# Patient Record
Sex: Female | Born: 1945 | Race: White | Hispanic: No | Marital: Single | Smoking: Former smoker
Health system: Southern US, Community
[De-identification: ages and names within clinical notes are randomized; demographics above are authoritative.]

## PROBLEM LIST (undated history)

## (undated) DIAGNOSIS — J45909 Unspecified asthma, uncomplicated: Secondary | ICD-10-CM

## (undated) DIAGNOSIS — R569 Unspecified convulsions: Secondary | ICD-10-CM

## (undated) DIAGNOSIS — E119 Type 2 diabetes mellitus without complications: Secondary | ICD-10-CM

## (undated) DIAGNOSIS — S0990XA Unspecified injury of head, initial encounter: Secondary | ICD-10-CM

## (undated) DIAGNOSIS — J449 Chronic obstructive pulmonary disease, unspecified: Secondary | ICD-10-CM

## (undated) DIAGNOSIS — E079 Disorder of thyroid, unspecified: Secondary | ICD-10-CM

## (undated) HISTORY — PX: CHOLECYSTECTOMY: SHX55

## (undated) HISTORY — PX: BACK SURGERY: SHX140

## (undated) HISTORY — PX: ABDOMINAL HYSTERECTOMY: SHX81

---

## 2018-10-31 ENCOUNTER — Emergency Department (HOSPITAL_BASED_OUTPATIENT_CLINIC_OR_DEPARTMENT_OTHER): Payer: Medicare Other

## 2018-10-31 ENCOUNTER — Other Ambulatory Visit: Payer: Self-pay

## 2018-10-31 ENCOUNTER — Encounter (HOSPITAL_BASED_OUTPATIENT_CLINIC_OR_DEPARTMENT_OTHER): Payer: Self-pay | Admitting: *Deleted

## 2018-10-31 ENCOUNTER — Emergency Department (HOSPITAL_BASED_OUTPATIENT_CLINIC_OR_DEPARTMENT_OTHER)
Admission: EM | Admit: 2018-10-31 | Discharge: 2018-10-31 | Disposition: A | Discharge status: Home / Self Care | Payer: Medicare Other | Attending: Emergency Medicine | Admitting: Emergency Medicine

## 2018-10-31 DIAGNOSIS — Y998 Other external cause status: Secondary | ICD-10-CM | POA: Insufficient documentation

## 2018-10-31 DIAGNOSIS — Z79899 Other long term (current) drug therapy: Secondary | ICD-10-CM | POA: Diagnosis not present

## 2018-10-31 DIAGNOSIS — W01198A Fall on same level from slipping, tripping and stumbling with subsequent striking against other object, initial encounter: Secondary | ICD-10-CM | POA: Insufficient documentation

## 2018-10-31 DIAGNOSIS — J449 Chronic obstructive pulmonary disease, unspecified: Secondary | ICD-10-CM | POA: Insufficient documentation

## 2018-10-31 DIAGNOSIS — M542 Cervicalgia: Secondary | ICD-10-CM | POA: Insufficient documentation

## 2018-10-31 DIAGNOSIS — Z87891 Personal history of nicotine dependence: Secondary | ICD-10-CM | POA: Diagnosis not present

## 2018-10-31 DIAGNOSIS — R519 Headache, unspecified: Secondary | ICD-10-CM | POA: Insufficient documentation

## 2018-10-31 DIAGNOSIS — Y9301 Activity, walking, marching and hiking: Secondary | ICD-10-CM | POA: Diagnosis not present

## 2018-10-31 DIAGNOSIS — Y92480 Sidewalk as the place of occurrence of the external cause: Secondary | ICD-10-CM | POA: Insufficient documentation

## 2018-10-31 DIAGNOSIS — E119 Type 2 diabetes mellitus without complications: Secondary | ICD-10-CM | POA: Diagnosis not present

## 2018-10-31 DIAGNOSIS — W19XXXA Unspecified fall, initial encounter: Secondary | ICD-10-CM

## 2018-10-31 HISTORY — DX: Type 2 diabetes mellitus without complications: E11.9

## 2018-10-31 HISTORY — DX: Chronic obstructive pulmonary disease, unspecified: J44.9

## 2018-10-31 HISTORY — DX: Disorder of thyroid, unspecified: E07.9

## 2018-10-31 HISTORY — DX: Unspecified convulsions: R56.9

## 2018-10-31 HISTORY — DX: Unspecified asthma, uncomplicated: J45.909

## 2018-10-31 HISTORY — DX: Unspecified injury of head, initial encounter: S09.90XA

## 2018-10-31 MED ORDER — ACETAMINOPHEN 325 MG PO TABS
650.0000 mg | ORAL_TABLET | Freq: Once | ORAL | Status: AC
  Administered 2018-10-31: 650 mg via ORAL
  Filled 2018-10-31: qty 2

## 2018-10-31 NOTE — ED Provider Notes (Addendum)
[facility] EMERGENCY DEPARTMENT Provider Note   CSN: [id number] Arrival date & time: 10/31/18  2048     History   Chief Complaint Chief Complaint  Patient presents with   Fall    HPI Kimberly Stafford is a 73 y.o. female with past medical history significant for asthma, copd, seizures, hypothyroidism since emergency department today with chief complaint of fall.  Patient is not anticoagulated.  Patient states she was walking her dog and she did not see the curb step up so she fell forward and landed on her left cheek.  She denies LOC.  Her neighbor heard and called 911.  Patient was able to get up with assistance and has been ambulatory since.  She is complaining of a mild headache and pain in her neck. She states pain has been constant. She describes it as aching, she rates pain 6/10 in severity. She has not taken anything for pain prior to arrival.    Past Medical History:  Diagnosis Date   Asthma    COPD (chronic obstructive pulmonary disease) (HCC)    Diabetes mellitus without complication (HCC)    states she had it for a while but "reversed it"   Head injury    Seizures (HCC)    Thyroid disease     There are no active problems to display for this patient.   Past Surgical History:  Procedure Laterality Date   ABDOMINAL HYSTERECTOMY     BACK SURGERY     CHOLECYSTECTOMY       OB History   No obstetric history on file.      Home Medications    Prior to Admission medications   Medication Sig Start Date End Date Taking? Authorizing Provider  DULoxetine (CYMBALTA) 30 MG capsule Take 30 mg by mouth daily.   Yes [provider]  lamoTRIgine (LAMICTAL) 200 MG tablet Take 100 mg by mouth 2 (two) times daily.   Yes [provider]  levothyroxine (SYNTHROID) 88 MCG tablet Take 88 mcg by mouth daily before breakfast.   Yes [provider]  metoprolol succinate (TOPROL-XL) 25 MG 24 hr tablet Take 12.5 mg by mouth at bedtime.   Yes  [provider]  montelukast (SINGULAIR) 10 MG tablet Take 10 mg by mouth at bedtime.   Yes [provider]    Family History No family history on file.  Social History Social History   Tobacco Use   Smoking status: Former Smoker   Smokeless tobacco: Never Used  Substance Use Topics   Alcohol use: Never    Frequency: Never   Drug use: Never     Allergies   Patient has no known allergies.   Review of Systems Review of Systems  Constitutional: Negative for chills and fever.  HENT: Negative for congestion, ear discharge, ear pain, facial swelling, sinus pressure, sinus pain and sore throat.   Eyes: Negative for pain and redness.  Respiratory: Negative for cough, shortness of breath and wheezing.   Cardiovascular: Negative for chest pain.  Gastrointestinal: Negative for abdominal pain, constipation, diarrhea, nausea and vomiting.  Genitourinary: Negative for dysuria and hematuria.  Musculoskeletal: Positive for arthralgias. Negative for back pain and neck pain.  Skin: Negative for wound.  Neurological: Negative for weakness, numbness and headaches.     Physical Exam Updated Vital Signs BP (!) 156/83 (BP Location: Right Arm)    Pulse 70    Temp 98.3 F (36.8 C) (Oral)    Resp 18    Ht   (1.6 m)    Wt 70.3 kg    SpO2 100%    BMI 27.46 kg/m   Physical Exam Vitals signs and nursing note reviewed.  Constitutional:      General: She is not in acute distress.    Appearance: She is not ill-appearing.  HENT:     Head: Normocephalic and atraumatic. No raccoon eyes, Battle's sign or contusion.     Jaw: There is normal jaw occlusion.      Comments: No tenderness to palpation of skull. No deformities or crepitus noted. No open wounds, abrasions or lacerations.    Right Ear: Tympanic membrane and external ear normal. No hemotympanum.     Left Ear: Tympanic membrane and external ear normal. No hemotympanum.     Nose: Nose normal. No nasal deformity or  nasal tenderness.     Right Nostril: No septal hematoma.     Left Nostril: No septal hematoma.     Mouth/Throat:     Mouth: Mucous membranes are moist.     Pharynx: Oropharynx is clear.  Eyes:     General: No scleral icterus.       Right eye: No discharge.        Left eye: No discharge.     Extraocular Movements: Extraocular movements intact.     Conjunctiva/sclera: Conjunctivae normal.     Pupils: Pupils are equal, round, and reactive to light.     Comments: No pain with EOMs.  Neck:     Musculoskeletal: Normal range of motion.     Vascular: No JVD.     Comments: Full ROM intact without spinous process TTP. No bony stepoffs or deformities, no paraspinous muscle TTP or muscle spasms. No bruising, erythema, or swelling.  Cardiovascular:     Rate and Rhythm: Normal rate and regular rhythm.     Pulses: Normal pulses.          Radial pulses are 2+ on the right side and 2+ on the left side.     Heart sounds: Normal heart sounds.  Pulmonary:     Comments: Lungs clear to auscultation in all fields. Symmetric chest rise. No wheezing, rales, or rhonchi. Abdominal:     Comments: Abdomen is soft, non-distended, and non-tender in all quadrants. No rigidity, no guarding. No peritoneal signs.  Musculoskeletal: Normal range of motion.     Comments: Pelvis is stable.  Full range of motion of all 4 extremities without signs of injury.  Full range of motion of the thoracic spine and lumbar spine. No cervical, thoracic, or lumbar spinal tenderness to palpation. No paraspinal tenderness. No step offs, crepitus or deformity palpated.   Skin:    General: Skin is warm and dry.     Capillary Refill: Capillary refill takes less than 2 seconds.  Neurological:     Mental Status: She is oriented to person, place, and time.     GCS: GCS eye subscore is 4. GCS verbal subscore is 5. GCS motor subscore is 6.     Comments: Speech is clear and goal oriented, follows commands CN III-XII intact, no facial  droop Normal strength in upper and lower extremities bilaterally including dorsiflexion and plantar flexion, strong and equal grip strength Sensation normal to light and sharp touch Moves extremities without ataxia, coordination intact Normal finger to nose and rapid alternating movements Normal gait and balance  Psychiatric:        Behavior: Behavior normal.      ED Treatments / Results  Labs (all labs ordered are listed, but only abnormal results are displayed) Labs Reviewed - No data to display  EKG None  Radiology Dg Chest 2 View  Result Date: 10/31/2018 CLINICAL DATA:  Larey Seat today. No reported chest pain. EXAM: CHEST - 2 VIEW COMPARISON:  None. FINDINGS: Normal sized heart. Tortuous aorta. Mild patchy opacity in the lateral aspect of the right lower lung zone. Mild diffuse peribronchial thickening. Diffuse osteopenia with no fracture or pneumothorax seen. IMPRESSION: 1. Mild patchy opacity in the lateral aspect of the right lower lung zone. This could represent a mild amount of pulmonary contusion or pneumonia. 2. Mild bronchitic changes. Electronically Signed   By: Beckie Salts M.D.   On: 10/31/2018 22:31   Dg Pelvis 1-2 Views  Result Date: 10/31/2018 CLINICAL DATA:  Larey Seat today. No reported pelvic pain. EXAM: PELVIS - 1-2 VIEW COMPARISON:  None. FINDINGS: Diffuse osteopenia. No fracture or dislocation seen. IMPRESSION: No fracture or dislocation. Electronically Signed   By: Beckie Salts M.D.   On: 10/31/2018 22:32   Ct Head Wo Contrast  Result Date: 10/31/2018 CLINICAL DATA:  Larey Seat and hit the left side of her face on a concrete patio. EXAM: CT HEAD WITHOUT CONTRAST CT MAXILLOFACIAL WITHOUT CONTRAST CT CERVICAL SPINE WITHOUT CONTRAST TECHNIQUE: Multidetector CT imaging of the head, cervical spine, and maxillofacial structures were performed using the standard protocol without intravenous contrast. Multiplanar CT image reconstructions of the cervical spine and maxillofacial  structures were also generated. COMPARISON:  None. FINDINGS: CT HEAD FINDINGS Brain: Mildly enlarged ventricles and cortical sulci. Mild patchy white matter low density in both cerebral hemispheres. No intracranial hemorrhage, mass lesion or CT evidence of acute infarction. Vascular: No hyperdense vessel or unexpected calcification. Skull: Bilateral hyperostosis frontalis. No fractures. Other: None. CT MAXILLOFACIAL FINDINGS Osseous: No fracture or mandibular dislocation. No destructive process. Orbits: Negative. No traumatic or inflammatory finding. Status post bilateral cataract extraction. Sinuses: Clear. Soft tissues: Negative. CT CERVICAL SPINE FINDINGS Alignment: Normal. Skull base and vertebrae: No acute fracture. No primary bone lesion or focal pathologic process. Soft tissues and spinal canal: No prevertebral fluid or swelling. No visible canal hematoma. Disc levels:  Mild multilevel degenerative changes. Upper chest: Lung apices. Other: Minimal carotid artery calcification on the right. IMPRESSION: 1. No skull fracture or intracranial hemorrhage. 2. No maxillofacial fracture. 3. No cervical spine fracture or subluxation. 4. Mild diffuse cerebral and cerebellar atrophy and mild chronic small vessel white matter ischemic changes in both cerebral hemispheres. 5. Mild cervical spine degenerative changes. 6. Minimal right carotid artery atheromatous calcification. Electronically Signed   By: Beckie Salts M.D.   On: 10/31/2018 22:18   Ct Cervical Spine Wo Contrast  Result Date: 10/31/2018 CLINICAL DATA:  Larey Seat and hit the left side of her face on a concrete patio. EXAM: CT HEAD WITHOUT CONTRAST CT MAXILLOFACIAL WITHOUT CONTRAST CT CERVICAL SPINE WITHOUT CONTRAST TECHNIQUE: Multidetector CT imaging of the head, cervical spine, and maxillofacial structures were performed using the standard protocol without intravenous contrast. Multiplanar CT image reconstructions of the cervical spine and maxillofacial  structures were also generated. COMPARISON:  None. FINDINGS: CT HEAD FINDINGS Brain: Mildly enlarged ventricles and cortical sulci. Mild patchy white matter low density in both cerebral hemispheres. No intracranial hemorrhage, mass lesion or CT evidence of acute infarction. Vascular: No hyperdense vessel or unexpected calcification. Skull: Bilateral hyperostosis frontalis. No fractures. Other: None. CT MAXILLOFACIAL FINDINGS Osseous: No fracture or mandibular dislocation. No destructive process. Orbits: Negative. No traumatic or inflammatory finding. Status  post bilateral cataract extraction. Sinuses: Clear. Soft tissues: Negative. CT CERVICAL SPINE FINDINGS Alignment: Normal. Skull base and vertebrae: No acute fracture. No primary bone lesion or focal pathologic process. Soft tissues and spinal canal: No prevertebral fluid or swelling. No visible canal hematoma. Disc levels:  Mild multilevel degenerative changes. Upper chest: Lung apices. Other: Minimal carotid artery calcification on the right. IMPRESSION: 1. No skull fracture or intracranial hemorrhage. 2. No maxillofacial fracture. 3. No cervical spine fracture or subluxation. 4. Mild diffuse cerebral and cerebellar atrophy and mild chronic small vessel white matter ischemic changes in both cerebral hemispheres. 5. Mild cervical spine degenerative changes. 6. Minimal right carotid artery atheromatous calcification. Electronically Signed   By: Claudie Revering M.D.   On: 10/31/2018 22:18   Ct Maxillofacial Wo Contrast  Result Date: 10/31/2018 CLINICAL DATA:  [address] and hit the left side of her face on a concrete patio. EXAM: CT HEAD WITHOUT CONTRAST CT MAXILLOFACIAL WITHOUT CONTRAST CT CERVICAL SPINE WITHOUT CONTRAST TECHNIQUE: Multidetector CT imaging of the head, cervical spine, and maxillofacial structures were performed using the standard protocol without intravenous contrast. Multiplanar CT image reconstructions of the cervical spine and maxillofacial  structures were also generated. COMPARISON:  None. FINDINGS: CT HEAD FINDINGS Brain: Mildly enlarged ventricles and cortical sulci. Mild patchy white matter low density in both cerebral hemispheres. No intracranial hemorrhage, mass lesion or CT evidence of acute infarction. Vascular: No hyperdense vessel or unexpected calcification. Skull: Bilateral hyperostosis frontalis. No fractures. Other: None. CT MAXILLOFACIAL FINDINGS Osseous: No fracture or mandibular dislocation. No destructive process. Orbits: Negative. No traumatic or inflammatory finding. Status post bilateral cataract extraction. Sinuses: Clear. Soft tissues: Negative. CT CERVICAL SPINE FINDINGS Alignment: Normal. Skull base and vertebrae: No acute fracture. No primary bone lesion or focal pathologic process. Soft tissues and spinal canal: No prevertebral fluid or swelling. No visible canal hematoma. Disc levels:  Mild multilevel degenerative changes. Upper chest: Lung apices. Other: Minimal carotid artery calcification on the right. IMPRESSION: 1. No skull fracture or intracranial hemorrhage. 2. No maxillofacial fracture. 3. No cervical spine fracture or subluxation. 4. Mild diffuse cerebral and cerebellar atrophy and mild chronic small vessel white matter ischemic changes in both cerebral hemispheres. 5. Mild cervical spine degenerative changes. 6. Minimal right carotid artery atheromatous calcification. Electronically Signed   By: Claudie Revering M.D.   On: 10/31/2018 22:18    Procedures Procedures (including critical care time)  Medications Ordered in ED Medications  acetaminophen (TYLENOL) tablet 650 mg (650 mg Oral Given 10/31/18 2147)     Initial Impression / Assessment and Plan / ED Course  I have reviewed the triage vital signs and the nursing notes.  Pertinent labs & imaging results that were available during my care of the patient were reviewed by me and considered in my medical decision making (see chart for details).  Patient  presents after a head injury. Patient denies  red flags including LOC, significant mechanism, vomiting, seizure, severe headache, intoxication, or neuro deficits. Neurological exam is normal. No signs of skull fracture including battle's sign, raccoon eyes, nasal CSF leak, or hemotympanum present. Pupils are symmetric and no distracting injury is present. CT head, cervical spine, maxillofacial are negative for an acute intracranial abnormality.  X-ray of chest with report of mild patchy opacity in the lateral aspect of the right lower lung zone. This could represent a mild amount of pulmonary contusion or pneumonia.  On exam lungs are clear to auscultation in all fields.  Doubt pneumonia as she has  no respiratory symptoms.  Given her reassuring exam will not start antibiotic at this time.  X-ray of pelvis is negative for fracture dislocation.  Ambulated patient and she did so with steady gait. Patient is stable and will be discharged. Discussed strict return precautions with patient and her niece. Patient states understanding and agrees with plan. The patient was discussed with and seen by Dr. Particia Nearing who agrees with the treatment plan.    Portions of this note were generated with Scientist, clinical (histocompatibility and immunogenetics). Dictation errors may occur despite best attempts at proofreading.  Final Clinical Impressions(s) / ED Diagnoses   Final diagnoses:  Fall, initial encounter    ED Discharge Orders    None       Sherene Sires, PA-C 10/31/18 2351    Alexiz Sustaita, [place], PA-C 11/01/18 0054    Jacalyn Lefevre, MD 11/01/18 [phone]

## 2018-10-31 NOTE — Discharge Instructions (Signed)
°  Your CT scans and xrays today do not show any broken bones or bleeding in your brain.  Adult head injury: You have had a head injury which does not appear to require admission at this time. A concussion is a state of changed mental ability from trauma.  SEEK IMMEDIATE MEDICAL ATTENTION IF:  - There is confusion or drowsiness. You cannot awaken the injured person.   - There is nausea (feeling sick to your stomach) or continued, forceful vomiting. You notice dizziness or unsteadiness which is getting worse, or inability to walk.   - You have convulsions or unconsciousness.   - You experience severe, persistent headaches not relieved by Tylenol. (Do not take aspirin as this impairs clotting abilities). Take other pain medications only as directed.   - You cannot use arms or legs normally.   - There are changes in pupil sizes. (This is the black center in the colored part of the eye)   - There is clear or bloody discharge from the nose or ears.   - Change in speech, vision, swallowing, or understanding.   - Localized weakness, numbness, tingling, or change in bowel or bladder control.

## 2018-10-31 NOTE — ED Triage Notes (Signed)
Pt reports she lives at the [place] and fell outside walking her dog. States she hit the left side of her face on cement patio when she fell. Denies pain at present. Pt is a poor historian, has Hx of multiple head injuries. Her niece and POA is at bedside

## 2018-11-26 ENCOUNTER — Telehealth: Payer: Self-pay | Admitting: Neurology

## 2018-11-26 NOTE — Telephone Encounter (Signed)
Of course she can be on the virtual visit. Please get the contact info. Thank you

## 2018-11-26 NOTE — Telephone Encounter (Signed)
Patient is in a SNF and her POA wants to be apart of the Virtual visit tomorrow on 11-27-18 @ 1:00 she wants to make sure that we can do a like a three virtual visit with her and the SNF please call

## 2018-11-26 NOTE — Telephone Encounter (Signed)
Got the information that was needed for the SNF to do the virtual visit with POA as well

## 2018-11-27 ENCOUNTER — Encounter: Payer: Self-pay | Admitting: Neurology

## 2018-11-27 ENCOUNTER — Other Ambulatory Visit: Payer: Self-pay

## 2018-11-27 ENCOUNTER — Telehealth (INDEPENDENT_AMBULATORY_CARE_PROVIDER_SITE_OTHER): Payer: Medicare Other | Admitting: Neurology

## 2018-11-27 VITALS — Ht 61.0 in | Wt 155.0 lb

## 2018-11-27 DIAGNOSIS — F0391 Unspecified dementia with behavioral disturbance: Secondary | ICD-10-CM | POA: Diagnosis not present

## 2018-11-27 DIAGNOSIS — F03B18 Unspecified dementia, moderate, with other behavioral disturbance: Secondary | ICD-10-CM

## 2018-11-27 MED ORDER — QUETIAPINE FUMARATE 25 MG PO TABS: ORAL_TABLET | ORAL | 11 refills | Status: DC
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
## 2018-12-02 ENCOUNTER — Other Ambulatory Visit: Payer: Self-pay

## 2018-12-02 MED ORDER — QUETIAPINE FUMARATE 25 MG PO TABS: ORAL_TABLET | ORAL | 11 refills | Status: AC

## 2018-12-02 NOTE — Telephone Encounter (Signed)
Seroquel faxed to [facility] in [place]. Looks like the prescription was originally sent to the [facility] in [place].

## 2018-12-02 NOTE — Telephone Encounter (Signed)
Kimberly Stafford Kimberly Stafford) for patient called in today regarding her facility Kimberly Stafford not receiving Rx for Seroquel. She said she had called in this morning to see how she was doing on the new medication and they didn't have any new Rx. She Kimberly Stafford had just moved there and they have to use their pharmacy Kimberly Stafford 1 [phone]. She said that it can be called in or E scribed. Thank you

## 2018-12-03 ENCOUNTER — Emergency Department (HOSPITAL_COMMUNITY): Payer: Medicare Other

## 2018-12-03 ENCOUNTER — Emergency Department (HOSPITAL_COMMUNITY)
Admission: EM | Admit: 2018-12-03 | Discharge: 2018-12-03 | Disposition: A | Discharge status: Home / Self Care | Payer: Medicare Other | Attending: Emergency Medicine | Admitting: Emergency Medicine

## 2018-12-03 ENCOUNTER — Other Ambulatory Visit: Payer: Self-pay

## 2018-12-03 DIAGNOSIS — R519 Headache, unspecified: Secondary | ICD-10-CM | POA: Diagnosis not present

## 2018-12-03 DIAGNOSIS — W0110XA Fall on same level from slipping, tripping and stumbling with subsequent striking against unspecified object, initial encounter: Secondary | ICD-10-CM | POA: Insufficient documentation

## 2018-12-03 DIAGNOSIS — Y92129 Unspecified place in nursing home as the place of occurrence of the external cause: Secondary | ICD-10-CM | POA: Insufficient documentation

## 2018-12-03 DIAGNOSIS — Y999 Unspecified external cause status: Secondary | ICD-10-CM | POA: Insufficient documentation

## 2018-12-03 DIAGNOSIS — Z79899 Other long term (current) drug therapy: Secondary | ICD-10-CM | POA: Insufficient documentation

## 2018-12-03 DIAGNOSIS — J449 Chronic obstructive pulmonary disease, unspecified: Secondary | ICD-10-CM | POA: Insufficient documentation

## 2018-12-03 DIAGNOSIS — Z87891 Personal history of nicotine dependence: Secondary | ICD-10-CM | POA: Diagnosis not present

## 2018-12-03 DIAGNOSIS — E119 Type 2 diabetes mellitus without complications: Secondary | ICD-10-CM | POA: Insufficient documentation

## 2018-12-03 DIAGNOSIS — Y939 Activity, unspecified: Secondary | ICD-10-CM | POA: Diagnosis not present

## 2018-12-03 DIAGNOSIS — W19XXXA Unspecified fall, initial encounter: Secondary | ICD-10-CM

## 2018-12-03 NOTE — ED Notes (Signed)
[place]

## 2018-12-03 NOTE — ED Notes (Signed)
Pt unable to sign for AVS due to altered mental status related to dementia. AVS sent with PTAR for facility.

## 2018-12-03 NOTE — ED Triage Notes (Signed)
73 yo female BIB [facility] from [facility]. Pt had an unwitnessed fall approx. 45 min ago. Facility states she tripped over the coffee table. Pt c/o back pain and neck pain and headache. Pt states she feels as though she has a broken tooth. EMS placed pt in neck collar as precaution. Pt is not on blood thinners, no LOC as per [facility]. Pt is AOx2 at baseline.   Vitals: bp 135/75 Hr 64 rr 18 O2 97 cbg 122 Temp 97.4

## 2018-12-03 NOTE — ED Notes (Signed)
Patient transported to CT 

## 2018-12-03 NOTE — ED Notes (Signed)
[facility] called 

## 2018-12-03 NOTE — Discharge Instructions (Addendum)
Kimberly Stafford was seen in the ER for a fall  CT scans were normal  Monitor and return for sudden changes in mental status, stroke symptoms

## 2018-12-03 NOTE — ED Provider Notes (Signed)
[facility]-EMERGENCY DEPT Provider Note   CSN: [id number] Arrival date & time: 12/03/18  1628     History   Chief Complaint Chief Complaint  Patient presents with   Fall    HPI Kimberly Stafford is a 73 y.o. female brought to the ER by EMS from [facility] assisted living for evaluation of fall.  Per triage note, patient had an unwitnessed fall approximately an hour prior to arrival, facility staff reported patient tripped over a coffee table and fell backwards.  Patient is fully oriented to self, place, year.  When asked what happened she does not remember, she was told she had a fall and then was able to tell me she was walking and fell over a piece of furniture and fell backwards landing on her head.  Reports mild posterior scalp tenderness but otherwise denies any other physical injuries.  She was placed in cervical collar by EMS.  No modifying factors.  Denies associated headache, vision changes, chest pain, back pain or other extremity injury.  She is not on blood thinners per EMS.  POA is niece.     HPI  Past Medical History:  Diagnosis Date   Asthma    COPD (chronic obstructive pulmonary disease) (HCC)    Diabetes mellitus without complication (HCC)    states she had it for a while but "reversed it"   Head injury    Seizures (HCC)    Thyroid disease     There are no active problems to display for this patient.   Past Surgical History:  Procedure Laterality Date   ABDOMINAL HYSTERECTOMY     BACK SURGERY     CHOLECYSTECTOMY       OB History   No obstetric history on file.      Home Medications    Prior to Admission medications   Medication Sig Start Date End Date Taking? Authorizing Provider  DULoxetine (CYMBALTA) 30 MG capsule Take 30 mg by mouth daily.    [provider]  lamoTRIgine (LAMICTAL) 200 MG tablet Take 100 mg by mouth daily.     [provider]  levothyroxine (SYNTHROID) 88 MCG tablet Take 88 mcg by mouth  daily before breakfast.    [provider]  metoprolol succinate (TOPROL-XL) 25 MG 24 hr tablet Take 12.5 mg by mouth at bedtime.    [provider]  montelukast (SINGULAIR) 10 MG tablet Take 10 mg by mouth at bedtime.    [provider]  QUEtiapine (SEROQUEL) 25 MG tablet Take 1/2 tablet every night for 1 week, then increase to 1 tablet every night and continue 12/02/18   Van Clines, MD    Family History Family History  Problem Relation Age of Onset   COPD Mother 25   Diabetes Father 74   Diabetes Brother     Social History Social History   Tobacco Use   Smoking status: Former Smoker   Smokeless tobacco: Never Used  Substance Use Topics   Alcohol use: Never    Frequency: Never   Drug use: Never     Allergies   Patient has no known allergies.   Review of Systems Review of Systems  Unable to perform ROS: Dementia  All other systems reviewed and are negative.    Physical Exam Updated Vital Signs BP 108/84 (BP Location: Right Arm)    Pulse 63    Temp 98.3 F (36.8 C) (Axillary)    Resp 16    SpO2 99%  Physical Exam Vitals signs and nursing note reviewed.  Constitutional:      General: She is not in acute distress.    Appearance: She is well-developed.     Comments: NAD.  Awake, alert and Hall bed.  HENT:     Head: Normocephalic and atraumatic.     Comments: No scalp tenderness.  No signs of abrasion or injury to scalp.  No facial tenderness.    Right Ear: External ear normal.     Left Ear: External ear normal.     Nose: Nose normal.     Mouth/Throat:     Comments: No intraoral or tongue injury. Eyes:     General: No scleral icterus.    Conjunctiva/sclera: Conjunctivae normal.  Neck:     Musculoskeletal: Normal range of motion and neck supple.     Comments: C-spine: Diffuse midline and paraspinal muscle tenderness around C7.  Cervical collar in place.  Trachea midline. Cardiovascular:     Rate and Rhythm: Normal rate  and regular rhythm.     Heart sounds: Normal heart sounds.  Pulmonary:     Effort: Pulmonary effort is normal.     Breath sounds: Normal breath sounds.  Musculoskeletal: Normal range of motion.        General: No deformity.     Comments: Patient able to sit up on her own on the bed without obvious difficulty. TL spine: No midline or paraspinal muscle tenderness.  No bruising or signs of trauma to the back/skin. Full passive range of motion of the upper and lower extremities without pain.  Skin:    General: Skin is warm and dry.     Capillary Refill: Capillary refill takes less than 2 seconds.  Neurological:     Mental Status: She is alert and oriented to person, place, and time.     Comments:   Mental Status: Patient is awake, alert, oriented to person, place, year, and situation.  Patient is able to give a clear and coherent history. Speech is fluent and clear without dysarthria or aphasia. No signs of neglect. Cranial Nerves: I not tested II visual fields not tested. PERRL.   III, IV, VI EOMs intact without ptosis or diplopia  V sensation to light touch intact in all 3 divisions of trigeminal nerve bilaterally  VII facial movements symmetric bilaterally VIII hearing intact to voice/conversation  IX, X no uvula deviation, symmetric rise of soft palate/uvula XI 5/5 SCM and trapezius strength bilaterally  XII tongue protrusion midline, symmetric L/R movements  Motor: Strength 5/5 in upper/lower extremities .   Sensation to light touch intact in face, upper/lower extremities. No pronator drift. No leg drop.  Cerebellar: No ataxia with finger to nose.  Sits on side of bed without truncal sway.   Psychiatric:        Behavior: Behavior normal.        Thought Content: Thought content normal.        Judgment: Judgment normal.      ED Treatments / Results  Labs (all labs ordered are listed, but only abnormal results are displayed) Labs Reviewed - No data to  display  EKG None  Radiology Ct Head Wo Contrast  Result Date: 12/03/2018 CLINICAL DATA:  Head trauma, ataxia, unwitnessed fall EXAM: CT HEAD WITHOUT CONTRAST CT CERVICAL SPINE WITHOUT CONTRAST TECHNIQUE: Multidetector CT imaging of the head and cervical spine was performed following the standard protocol without intravenous contrast. Multiplanar CT image reconstructions of the cervical spine were also generated. COMPARISON:  CT 10/31/2018 FINDINGS: CT HEAD FINDINGS Brain: No evidence of acute infarction, hemorrhage, hydrocephalus, extra-axial collection or mass lesion/mass effect. Symmetric prominence of the ventricles, cisterns and sulci compatible with parenchymal volume loss. Patchy areas of white matter hypoattenuation are most compatible with chronic microvascular angiopathy. Vascular: Atherosclerotic calcification of the carotid siphons and intradural vertebral arteries. No hyperdense vessel. Skull: Mild left supraorbital soft tissue thickening. No subjacent hematoma or calvarial fracture. No suspicious osseous lesions. Hyperostosis frontalis interna is noted incidentally Sinuses/Orbits: Small amount of fluid layering in the sphenoid sinus, similar to prior. Remaining paranasal sinuses are predominantly clear. Mastoid air cells and middle ear cavities are clear. Other: None CT CERVICAL SPINE FINDINGS Alignment: Preservation of the normal cervical lordosis. Mild dextrocurvature of the cervical spine possibly positional with stabilization collar in place. Slight anterolisthesis of C4 on C5 and retrolisthesis C5 on C6 is on a degenerative basis, unchanged from comparison study. No abnormal facet widening. Craniocervical and atlantoaxial articulations are normally aligned. Skull base and vertebrae: No acute fracture. No primary bone lesion or focal pathologic process. Soft tissues and spinal canal: No pre or paravertebral fluid or swelling. No visible canal hematoma. Disc levels: Multilevel intervertebral  disc height loss with spondylitic endplate changes. Mild to moderate facet hypertrophic changes are noted as well. Findings are maximal at C5-6 and C6-7. At most mild multilevel neural foraminal narrowing. No significant canal stenosis. Upper chest: Apical pleuroparenchymal changes are noted. Other: Mild cervical carotid atherosclerosis. Included portions of the thyroid are unremarkable. IMPRESSION: 1. No acute intracranial abnormality. 2. Stable parenchymal volume loss and chronic microvascular ischemic white matter disease. 3. No acute cervical spine fracture or traumatic listhesis. 4. Stable multilevel degenerative changes of the cervical spine, detailed above. Electronically Signed   By: Kreg Shropshire M.D.   On: 12/03/2018 19:57   Ct Cervical Spine Wo Contrast  Result Date: 12/03/2018 CLINICAL DATA:  Head trauma, ataxia, unwitnessed fall EXAM: CT HEAD WITHOUT CONTRAST CT CERVICAL SPINE WITHOUT CONTRAST TECHNIQUE: Multidetector CT imaging of the head and cervical spine was performed following the standard protocol without intravenous contrast. Multiplanar CT image reconstructions of the cervical spine were also generated. COMPARISON:  CT 10/31/2018 FINDINGS: CT HEAD FINDINGS Brain: No evidence of acute infarction, hemorrhage, hydrocephalus, extra-axial collection or mass lesion/mass effect. Symmetric prominence of the ventricles, cisterns and sulci compatible with parenchymal volume loss. Patchy areas of white matter hypoattenuation are most compatible with chronic microvascular angiopathy. Vascular: Atherosclerotic calcification of the carotid siphons and intradural vertebral arteries. No hyperdense vessel. Skull: Mild left supraorbital soft tissue thickening. No subjacent hematoma or calvarial fracture. No suspicious osseous lesions. Hyperostosis frontalis interna is noted incidentally Sinuses/Orbits: Small amount of fluid layering in the sphenoid sinus, similar to prior. Remaining paranasal sinuses are  predominantly clear. Mastoid air cells and middle ear cavities are clear. Other: None CT CERVICAL SPINE FINDINGS Alignment: Preservation of the normal cervical lordosis. Mild dextrocurvature of the cervical spine possibly positional with stabilization collar in place. Slight anterolisthesis of C4 on C5 and retrolisthesis C5 on C6 is on a degenerative basis, unchanged from comparison study. No abnormal facet widening. Craniocervical and atlantoaxial articulations are normally aligned. Skull base and vertebrae: No acute fracture. No primary bone lesion or focal pathologic process. Soft tissues and spinal canal: No pre or paravertebral fluid or swelling. No visible canal hematoma. Disc levels: Multilevel intervertebral disc height loss with spondylitic endplate changes. Mild to moderate facet hypertrophic changes are noted as well. Findings are maximal at C5-6 and C6-7. At  most mild multilevel neural foraminal narrowing. No significant canal stenosis. Upper chest: Apical pleuroparenchymal changes are noted. Other: Mild cervical carotid atherosclerosis. Included portions of the thyroid are unremarkable. IMPRESSION: 1. No acute intracranial abnormality. 2. Stable parenchymal volume loss and chronic microvascular ischemic white matter disease. 3. No acute cervical spine fracture or traumatic listhesis. 4. Stable multilevel degenerative changes of the cervical spine, detailed above. Electronically Signed   By: Lovena Le M.D.   On: 12/03/2018 19:57    Procedures Procedures (including critical care time)  Medications Ordered in ED Medications - No data to display   Initial Impression / Assessment and Plan / ED Course  I have reviewed the triage vital signs and the nursing notes.  Pertinent labs & imaging results that were available during my care of the patient were reviewed by me and considered in my medical decision making (see chart for details).  Obtained corroborating information from patient's POA who  was notified patient had fallen backwards and hit the back of her head.  Exam is reassuring, no concerning findings to suggest severe head, cervical, TL spine or other extremity injury.  She is not on blood thinners.  She is fully oriented during my encounter but has documented history of dementia.  CT scans do not reveal any traumatic injuries.  2110: I contacted POA to update on CT and plan to discharge.  She expressed frustration because she tried to come visit patient in the ER but was told by charge RN Hanley Seamen visitors were not allowed to visit patients in the hall.  Apologized to patient regarding this miscommunication.  Offered holding patient in ER for her to see her, but stated she had already left and is home.  Appropriate for discharge back to facility.   Final Clinical Impressions(s) / ED Diagnoses   Final diagnoses:  Fall, initial encounter    ED Discharge Orders    None       Arlean Hopping 12/03/18 2112    Daleen Bo, MD 12/04/18 1139

## 2018-12-03 NOTE — ED Provider Notes (Signed)
  Face-to-face evaluation   History: She presents for evaluation of fall at her assisted living facility.  She denies headache, neck pain, back pain, nausea or vomiting.  Physical exam: Alert elderly female.  She is conversant.  No dysarthria or aphasia.  Head without palpable or visible injury.  I am able to mobilize arms and legs without pain.  Medical screening examination/treatment/procedure(s) were conducted as a shared visit with non-physician practitioner(s) and myself.  I personally evaluated the patient during the encounter    Daleen Bo, MD 12/04/18 1139

## 2018-12-07 ENCOUNTER — Telehealth: Payer: Self-pay | Admitting: Neurology

## 2018-12-07 ENCOUNTER — Other Ambulatory Visit: Payer: Self-pay

## 2018-12-07 DIAGNOSIS — F03B18 Unspecified dementia, moderate, with other behavioral disturbance: Secondary | ICD-10-CM

## 2018-12-07 DIAGNOSIS — F0391 Unspecified dementia with behavioral disturbance: Secondary | ICD-10-CM

## 2018-12-07 NOTE — Telephone Encounter (Signed)
POA left msg about following up on MRI that was supposed to be scheduled. She hasn't heard anything and is wanting to check on that. Thanks!

## 2018-12-07 NOTE — Telephone Encounter (Signed)
MRI Brain w/wo ordered. Dx Moderate dementia.

## 2018-12-07 NOTE — Telephone Encounter (Signed)
Asking Dr. Delice Lesch what type of MRI pt needs. No order has been placed. Kimberly Stafford has been given number to call [facility] after order has been put in.

## 2018-12-17 ENCOUNTER — Emergency Department (HOSPITAL_COMMUNITY): Payer: Medicare Other

## 2018-12-17 ENCOUNTER — Encounter: Payer: Medicare Other | Admitting: Internal Medicine

## 2018-12-17 ENCOUNTER — Emergency Department (HOSPITAL_COMMUNITY)
Admission: EM | Admit: 2018-12-17 | Discharge: 2018-12-17 | Disposition: A | Discharge status: Home / Self Care | Payer: Medicare Other | Attending: Emergency Medicine | Admitting: Emergency Medicine

## 2018-12-17 ENCOUNTER — Encounter (HOSPITAL_COMMUNITY): Payer: Self-pay

## 2018-12-17 ENCOUNTER — Telehealth: Payer: Self-pay | Admitting: Neurology

## 2018-12-17 ENCOUNTER — Other Ambulatory Visit: Payer: Self-pay

## 2018-12-17 DIAGNOSIS — Z87891 Personal history of nicotine dependence: Secondary | ICD-10-CM | POA: Insufficient documentation

## 2018-12-17 DIAGNOSIS — E079 Disorder of thyroid, unspecified: Secondary | ICD-10-CM | POA: Insufficient documentation

## 2018-12-17 DIAGNOSIS — F0390 Unspecified dementia without behavioral disturbance: Secondary | ICD-10-CM | POA: Diagnosis not present

## 2018-12-17 DIAGNOSIS — J449 Chronic obstructive pulmonary disease, unspecified: Secondary | ICD-10-CM | POA: Diagnosis not present

## 2018-12-17 DIAGNOSIS — E860 Dehydration: Secondary | ICD-10-CM | POA: Diagnosis not present

## 2018-12-17 DIAGNOSIS — E119 Type 2 diabetes mellitus without complications: Secondary | ICD-10-CM | POA: Insufficient documentation

## 2018-12-17 DIAGNOSIS — R569 Unspecified convulsions: Secondary | ICD-10-CM | POA: Insufficient documentation

## 2018-12-17 DIAGNOSIS — R4182 Altered mental status, unspecified: Secondary | ICD-10-CM | POA: Diagnosis present

## 2018-12-17 DIAGNOSIS — Z79899 Other long term (current) drug therapy: Secondary | ICD-10-CM | POA: Insufficient documentation

## 2018-12-17 LAB — HEPATIC FUNCTION PANEL
ALT: 22 U/L (ref 0–44)
AST: 26 U/L (ref 15–41)
Albumin: 4.2 g/dL (ref 3.5–5.0)
Alkaline Phosphatase: 78 U/L (ref 38–126)
Bilirubin, Direct: 0.4 mg/dL — ABNORMAL HIGH (ref 0.0–0.2)
Indirect Bilirubin: 1.4 mg/dL — ABNORMAL HIGH (ref 0.3–0.9)
Total Bilirubin: 1.8 mg/dL — ABNORMAL HIGH (ref 0.3–1.2)
Total Protein: 7.2 g/dL (ref 6.5–8.1)

## 2018-12-17 LAB — URINALYSIS, ROUTINE W REFLEX MICROSCOPIC
Bacteria, UA: NONE SEEN
Glucose, UA: NEGATIVE mg/dL
Ketones, ur: 20 mg/dL — AB
Leukocytes,Ua: NEGATIVE
Nitrite: NEGATIVE
Protein, ur: 100 mg/dL — AB
Specific Gravity, Urine: 1.028 (ref 1.005–1.030)
pH: 6 (ref 5.0–8.0)

## 2018-12-17 LAB — CBC WITH DIFFERENTIAL/PLATELET
Abs Immature Granulocytes: 0.08 10*3/uL — ABNORMAL HIGH (ref 0.00–0.07)
Basophils Absolute: 0 10*3/uL (ref 0.0–0.1)
Basophils Relative: 0 %
Eosinophils Absolute: 0 10*3/uL (ref 0.0–0.5)
Eosinophils Relative: 0 %
HCT: 50.1 % — ABNORMAL HIGH (ref 36.0–46.0)
Hemoglobin: 16.2 g/dL — ABNORMAL HIGH (ref 12.0–15.0)
Immature Granulocytes: 1 %
Lymphocytes Relative: 8 %
Lymphs Abs: 1 10*3/uL (ref 0.7–4.0)
MCH: 29.2 pg (ref 26.0–34.0)
MCHC: 32.3 g/dL (ref 30.0–36.0)
MCV: 90.3 fL (ref 80.0–100.0)
Monocytes Absolute: 0.6 10*3/uL (ref 0.1–1.0)
Monocytes Relative: 5 %
Neutro Abs: 10.4 10*3/uL — ABNORMAL HIGH (ref 1.7–7.7)
Neutrophils Relative %: 86 %
Platelets: 310 10*3/uL (ref 150–400)
RBC: 5.55 MIL/uL — ABNORMAL HIGH (ref 3.87–5.11)
RDW: 15.8 % — ABNORMAL HIGH (ref 11.5–15.5)
WBC: 12.1 10*3/uL — ABNORMAL HIGH (ref 4.0–10.5)
nRBC: 0 % (ref 0.0–0.2)

## 2018-12-17 LAB — BASIC METABOLIC PANEL
Anion gap: 18 — ABNORMAL HIGH (ref 5–15)
BUN: 58 mg/dL — ABNORMAL HIGH (ref 8–23)
CO2: 23 mmol/L (ref 22–32)
Calcium: 10 mg/dL (ref 8.9–10.3)
Chloride: 108 mmol/L (ref 98–111)
Creatinine, Ser: 0.96 mg/dL (ref 0.44–1.00)
GFR calc Af Amer: >60 mL/min (ref >60)
GFR calc non Af Amer: 58 mL/min — ABNORMAL LOW (ref >60)
Glucose, Bld: 112 mg/dL — ABNORMAL HIGH (ref 70–99)
Potassium: 3.1 mmol/L — ABNORMAL LOW (ref 3.5–5.1)
Sodium: 149 mmol/L — ABNORMAL HIGH (ref 135–145)

## 2018-12-17 LAB — LIPASE, BLOOD: Lipase: 46 U/L (ref 11–51)

## 2018-12-17 MED ORDER — SODIUM CHLORIDE 0.9 % IV BOLUS
1000.0000 mL | Freq: Once | INTRAVENOUS | Status: AC
  Administered 2018-12-17: 15:00:00 1000 mL via INTRAVENOUS

## 2018-12-17 NOTE — ED Triage Notes (Signed)
Patient BIB EMS from [facility]F). Patient has hx of dementia and is experiencing increased confusion, decreased PO intake, and decreased compliance with medications. Patient family requesting consult to consider Memory Care ward, rather than Assisted Living.   Patient alert to person and place, but disoriented to time and situation.

## 2018-12-17 NOTE — TOC Initial Note (Addendum)
Transition of Care [facility]) - Initial/Assessment Note    Patient Details  Name: Kimberly Stafford MRN: [id number] Date of Birth: 1945/08/05  Transition of Care [facility]) CM/SW Contact:    Janace Hoard, LCSW Phone Number: 12/17/2018, 1:55 PM  Clinical Narrative:                 CSW spoke with patient's niece, [name] ([phone]), regarding her request for patient to move to the memory care unit at [facility]. [name] reports she feels patient will be better suited there due to her increased confusion and wandering. She has concerns that patient will not be able to go to the memory care unit until next weekend as [facility] does not do the transitions over the weekend. CSW informed [name] that an FL2 can be completed here at the hospital, but if patient is discharged she will have to return to [facility] until the can get her transitioned.   CSW spoke with ALF Coordinator, Lonn Georgia, at [facility] who reports they will take patient back into the ALF side and continue to do 30 minute checks on her until she can transition to memory care next week. Lonn Georgia agrees that patient does need memory care due to her wandering. CSW to fax FL2 to [facility] at [phone].   CSW followed up with [name] about the plan and she understood and agreed. Patient to be transported via [place]. Niece is aware.   Expected Discharge Plan: (ALF) Barriers to Discharge: No Barriers Identified   Patient Goals and CMS Choice Patient states their goals for this hospitalization and ongoing recovery are:: Per Neice "to get patient evaluated and into memory care unit."      Expected Discharge Plan and Services Expected Discharge Plan: (ALF) In-house Referral: Clinical Social Work     Living arrangements for the past 2 months: [place]                                      Prior Living Arrangements/Services Living arrangements for the past 2 months: [place] Lives with:: Facility Resident Patient language and need for interpreter reviewed:: Yes Do you feel safe going back to the place where you live?: Yes      Need for Family Participation in Patient Care: Yes (Comment) Care giver support system in place?: Yes (comment)   Criminal Activity/Legal Involvement Pertinent to Current Situation/Hospitalization: No - Comment as needed  Activities of Daily Living      Permission Sought/Granted Permission sought to share information with : Family Supports, Customer service manager    Share Information with NAME: [name]  Permission granted to share info w AGENCY: [place] granted to share info w Relationship: Niece  Permission granted to share info w Contact Information: [phone]  Emotional Assessment Appearance:: (Unable to assess) Attitude/Demeanor/Rapport: Unable to Assess Affect (typically observed): Unable to Assess Orientation: : Fluctuating Orientation (Suspected and/or reported Sundowners)      Admission diagnosis:  Faliure to Thrive There are no active problems to display for this patient.  PCP:  System, Pcp Not In Pharmacy:   [facility], [place] - [place] [address] Phone: [phone] Fax: [phone]  [place], [place] [place]. [place] [address] Phone: [phone] Fax: [phone]     Social  Determinants of Health (SDOH) Interventions    Readmission Risk Interventions No flowsheet data found.

## 2018-12-17 NOTE — ED Notes (Signed)
PTAR present to transport patient back to [facility].

## 2018-12-17 NOTE — ED Notes (Signed)
X-ray at bedside

## 2018-12-17 NOTE — NC FL2 (Signed)
  [place] LEVEL OF CARE SCREENING TOOL     IDENTIFICATION  Patient Name: Kimberly Stafford Birthdate: 06-12-1945 Sex: female Admission Date (Current Location): 12/17/2018  [facility] and [place] Number:  Herbalist and Address:  [facility],  [place] [address], [place]      Provider Number: [id number]  Attending Physician Name and Address:  Lennice Sites, DO  Relative Name and Phone Number:  [name] (Niece) [phone]    Current Level of Care: Hospital Recommended Level of Care: Memory Care Prior Approval Number:    Date Approved/Denied:   PASRR Number:    Discharge Plan: Other (Comment)(Memory Care)    Current Diagnoses: There are no active problems to display for this patient.   Orientation RESPIRATION BLADDER Height & Weight     Self, Place(fluctuates due to dementia)  Normal Continent Weight: 154 lb 15.7 oz (70.3 kg) Height:  5\' 1"  (154.9 cm)  BEHAVIORAL SYMPTOMS/MOOD NEUROLOGICAL BOWEL NUTRITION STATUS    (Hx of seizures) Continent Diet(Regular)  AMBULATORY STATUS COMMUNICATION OF NEEDS Skin   Independent Verbally Normal                       Personal Care Assistance Level of Assistance  Bathing, Feeding, Dressing Bathing Assistance: Limited assistance Feeding assistance: Independent Dressing Assistance: Limited assistance     Functional Limitations Info  Sight, Hearing, Speech Sight Info: Adequate Hearing Info: Adequate Speech Info: Adequate    SPECIAL CARE FACTORS FREQUENCY                       Contractures Contractures Info: Not present    Additional Factors Info  Code Status, Allergies Code Status Info: Full Allergies Info: NKA           Current Medications (12/17/2018):  This is the current hospital active medication list No current facility-administered medications for this encounter.    Current Outpatient Medications  Medication Sig Dispense Refill  . DULoxetine  (CYMBALTA) 30 MG capsule Take 30 mg by mouth daily.    Marland Kitchen lamoTRIgine (LAMICTAL) 200 MG tablet Take 100 mg by mouth daily.     Marland Kitchen levothyroxine (SYNTHROID) 88 MCG tablet Take 88 mcg by mouth daily before breakfast.    . metoprolol succinate (TOPROL-XL) 25 MG 24 hr tablet Take 25 mg by mouth at bedtime.     . montelukast (SINGULAIR) 10 MG tablet Take 10 mg by mouth at bedtime.    Marland Kitchen PRESCRIPTION MEDICATION Take 1 Container by mouth 2 (two) times daily. Magic cup for supplement    . QUEtiapine (SEROQUEL) 25 MG tablet Take 1/2 tablet every night for 1 week, then increase to 1 tablet every night and continue (Patient taking differently: Take 25 mg by mouth at bedtime. ) 30 tablet 11     Discharge Medications: Please see discharge summary for a list of discharge medications.  Relevant Imaging Results:  Relevant Lab Results:   Additional Information SS# [id number]  Janace Hoard, LCSW

## 2018-12-17 NOTE — Telephone Encounter (Signed)
[name] called regarding patient and her needing to be in a Memory care unit verses assisted Living but she will need an order. She said the patient is at [facility]. She is refusing her Seizure medications and they are wanting to take her to the ER. She said she has tried to reach Dr. Sherilyn Cooter office and has not heard back from him. She said she would like to speak with Dr. Delice Lesch to get Advise. Thank you

## 2018-12-17 NOTE — ED Notes (Signed)
PTAR called for patient transport back to [facility].

## 2018-12-17 NOTE — ED Provider Notes (Signed)
[facility]-EMERGENCY DEPT Provider Note   CSN: [id number] Arrival date & time: 12/17/18  1305     History   Chief Complaint Chief Complaint  Patient presents with  . Altered Mental Status    HPI Kimberly Stafford is a 73 y.o. female.     Patient here due to worsening dementia type symptoms.  Worsening confusion over the last several days to weeks.  Family member states that they are trying to get her moved to memory care ward.  She currently lives in assisted living facility where she is checked on every 2 hours.  No specific complaints.  No recent falls or urinary symptoms.  Patient pleasantly confused.  The history is provided by the patient and a caregiver.  Altered Mental Status Presenting symptoms: confusion   Severity:  Mild Most recent episode:  More than 2 days ago Episode history:  Multiple Timing:  Intermittent Progression:  Waxing and waning Chronicity:  Recurrent Context: dementia   Associated symptoms: no abdominal pain, no fever, no palpitations, no rash, no seizures and no vomiting     Past Medical History:  Diagnosis Date  . Asthma   . COPD (chronic obstructive pulmonary disease) (HCC)   . Diabetes mellitus without complication (HCC)    states she had it for a while but "reversed it"  . Head injury   . Seizures (HCC)   . Thyroid disease     There are no active problems to display for this patient.   Past Surgical History:  Procedure Laterality Date  . ABDOMINAL HYSTERECTOMY    . BACK SURGERY    . CHOLECYSTECTOMY       OB History   No obstetric history on file.      Home Medications    Prior to Admission medications   Medication Sig Start Date End Date Taking? Authorizing Provider  DULoxetine (CYMBALTA) 30 MG capsule Take 30 mg by mouth daily.   Yes [provider]  lamoTRIgine (LAMICTAL) 200 MG tablet Take 100 mg by mouth daily.    Yes [provider]  levothyroxine (SYNTHROID) 88 MCG tablet Take 88  mcg by mouth daily before breakfast.   Yes [provider]  metoprolol succinate (TOPROL-XL) 25 MG 24 hr tablet Take 25 mg by mouth at bedtime.    Yes [provider]  montelukast (SINGULAIR) 10 MG tablet Take 10 mg by mouth at bedtime.   Yes [provider]  PRESCRIPTION MEDICATION Take 1 Container by mouth 2 (two) times daily. Magic cup for supplement   Yes [provider]  QUEtiapine (SEROQUEL) 25 MG tablet Take 1/2 tablet every night for 1 week, then increase to 1 tablet every night and continue Patient taking differently: Take 25 mg by mouth at bedtime.  12/02/18  Yes Van Clines, MD    Family History Family History  Problem Relation Age of Onset  . COPD Mother 41  . Diabetes Father 57  . Diabetes Brother     Social History Social History   Tobacco Use  . Smoking status: Former Games developer  . Smokeless tobacco: Never Used  Substance Use Topics  . Alcohol use: Never    Frequency: Never  . Drug use: Never     Allergies   Patient has no known allergies.   Review of Systems Review of Systems  Constitutional: Negative for chills and fever.  HENT: Negative for ear pain and sore throat.   Eyes: Negative for pain and visual disturbance.  Respiratory:  Negative for cough and shortness of breath.   Cardiovascular: Negative for chest pain and palpitations.  Gastrointestinal: Negative for abdominal pain and vomiting.  Genitourinary: Negative for dysuria and hematuria.  Musculoskeletal: Negative for arthralgias and back pain.  Skin: Negative for color change and rash.  Neurological: Negative for seizures and syncope.  Psychiatric/Behavioral: Positive for confusion.  All other systems reviewed and are negative.    Physical Exam Updated Vital Signs  ED Triage Vitals  Enc Vitals Group     BP 12/17/18 1328 106/83     Pulse Rate 12/17/18 1328 (!) 101     Resp 12/17/18 1328 18     Temp 12/17/18 1328 98 F (36.7 C)     Temp Source 12/17/18  1328 Oral     SpO2 12/17/18 1328 97 %     Weight 12/17/18 1314 154 lb 15.7 oz (70.3 kg)     Height 12/17/18 1314 5\' 1"  (1.549 m)     Head Circumference --      Peak Flow --      Pain Score --      Pain Loc --      Pain Edu? --      Excl. in GC? --     Physical Exam Vitals signs and nursing note reviewed.  Constitutional:      General: She is not in acute distress.    Appearance: She is well-developed. She is not ill-appearing.  HENT:     Head: Normocephalic and atraumatic.     Nose: Nose normal.     Mouth/Throat:     Mouth: Mucous membranes are moist.  Eyes:     Extraocular Movements: Extraocular movements intact.     Conjunctiva/sclera: Conjunctivae normal.     Pupils: Pupils are equal, round, and reactive to light.  Neck:     Musculoskeletal: Neck supple.  Cardiovascular:     Rate and Rhythm: Normal rate and regular rhythm.     Heart sounds: No murmur.  Pulmonary:     Effort: Pulmonary effort is normal. No respiratory distress.     Breath sounds: Normal breath sounds.  Abdominal:     Palpations: Abdomen is soft.     Tenderness: There is no abdominal tenderness.  Skin:    General: Skin is warm and dry.  Neurological:     Mental Status: She is alert. Mental status is at baseline.     Comments: Pleasant, no focal neurological findings      ED Treatments / Results  Labs (all labs ordered are listed, but only abnormal results are displayed) Labs Reviewed  CBC WITH DIFFERENTIAL/PLATELET - Abnormal; Notable for the following components:      Result Value   WBC 12.1 (*)    RBC 5.55 (*)    Hemoglobin 16.2 (*)    HCT 50.1 (*)    RDW 15.8 (*)    Neutro Abs 10.4 (*)    Abs Immature Granulocytes 0.08 (*)    All other components within normal limits  BASIC METABOLIC PANEL - Abnormal; Notable for the following components:   Sodium 149 (*)    Potassium 3.1 (*)    Glucose, Bld 112 (*)    BUN 58 (*)    GFR calc non Af Amer 58 (*)    Anion gap 18 (*)    All other  components within normal limits  HEPATIC FUNCTION PANEL - Abnormal; Notable for the following components:   Total Bilirubin 1.8 (*)    Bilirubin, Direct  0.4 (*)    Indirect Bilirubin 1.4 (*)    All other components within normal limits  URINALYSIS, ROUTINE W REFLEX MICROSCOPIC - Abnormal; Notable for the following components:   Color, Urine AMBER (*)    Hgb urine dipstick MODERATE (*)    Bilirubin Urine SMALL (*)    Ketones, ur 20 (*)    Protein, ur 100 (*)    All other components within normal limits  LIPASE, BLOOD    EKG None  Radiology Ct Head Wo Contrast  Result Date: 12/17/2018 CLINICAL DATA:  Altered LOC EXAM: CT HEAD WITHOUT CONTRAST TECHNIQUE: Contiguous axial images were obtained from the base of the skull through the vertex without intravenous contrast. COMPARISON:  CT brain 12/03/2018 FINDINGS: Brain: No acute territorial infarction, hemorrhage or intracranial mass. Mild atrophy and small vessel ischemic changes of the white matter. Stable ventricle size. Vascular: No hyperdense vessels.  Scattered carotid calcifications. Skull: Normal. Negative for fracture or focal lesion. Sinuses/Orbits: No acute finding. Other: None IMPRESSION: 1. No CT evidence for acute intracranial abnormality. 2. Mild atrophy and small vessel ischemic changes of the white matter. Electronically Signed   By: Donavan Foil M.D.   On: 12/17/2018 15:23   Dg Chest Portable 1 View  Result Date: 12/17/2018 CLINICAL DATA:  Altered mental status EXAM: PORTABLE CHEST 1 VIEW COMPARISON:  Chest radiograph dated 10/31/2018 FINDINGS: The patient is rotated to the right. The heart size is normal. The mediastinal contours are normal given patient positioning. The lungs are clear. The osseous structures are intact. IMPRESSION: No active disease. Electronically Signed   By: Zerita Boers M.D.   On: 12/17/2018 13:54    Procedures Procedures (including critical care time)  Medications Ordered in ED Medications   sodium chloride 0.9 % bolus 1,000 mL (1,000 mLs Intravenous New Bag/Given 12/17/18 1432)     Initial Impression / Assessment and Plan / ED Course  I have reviewed the triage vital signs and the nursing notes.  Pertinent labs & imaging results that were available during my care of the patient were reviewed by me and considered in my medical decision making (see chart for details).     Kimberly Stafford is a 73 year old female with history of dementia who presents the ED for increased confusion.  Patient with normal vitals.  No fever.  Family member states patient is outgrowing her care at her facility where she lives in an assisted living facility with someone checks on her every 2 hours.  Has had decreased p.o. intake over the last several days.  They are currently trying to get the patient in the memory care unit.  No recent falls.  Patient overall appears well.  Does not have any complaints.  Infectious work-up was initiated that showed no signs of pneumonia, no urinary tract infection.  Possibly mild dehydration.  Given a liter of fluids.  Sodium is 149 but otherwise unremarkable electrolytes.  This is likely due to dehydration.  No significant anemia or leukocytosis otherwise.  Case management was consulted and patient will be able to go to memory care unit this weekend.  CT scan of the head showed no acute findings.  Discharged from ED in good condition.  Given return precautions. Recommend PCP follow to recheck BMP.  This chart was dictated using voice recognition software.  Despite best efforts to proofread,  errors can occur which can change the documentation meaning.    Final Clinical Impressions(s) / ED Diagnoses   Final diagnoses:  Dehydration  Dementia  without behavioral disturbance, unspecified dementia type [facility](HCC)    ED Discharge Orders    None       Virgina NorfolkCuratolo, Zaniel Marineau, DO 12/17/18 1534

## 2018-12-17 NOTE — Discharge Instructions (Addendum)
Continue to encourage fluids and food.  Follow-up with your primary care doctor.  Have them recheck your electrolytes including your sodium.

## 2018-12-17 NOTE — Telephone Encounter (Signed)
Per Dr. Delice Lesch the facility where pt lives will need to fax an FL-2 form to our office for completion to have pt admitted to a Memory care facility. [name] made aware of this. Fax number given. [name] states that pt is at [facility] currently under the orders of [facility]. [name] is not able to go in with pt and she is very frustrated about that. She states that the hospital in [facility] let her go in with pt?

## 2018-12-17 NOTE — Progress Notes (Deleted)
    [place] Consult Note Telephone: [phone]  Fax: [phone]  PATIENT NAME: Kimberly Stafford DOB: 30-Apr-1945 MRN: [id number]  PRIMARY CARE PROVIDER:   System, Pcp Not In  REFERRING PROVIDER:  No referring provider defined for this encounter.  RESPONSIBLE PARTY:     ASSESSMENT:        RECOMMENDATIONS and PLAN:  1.  I spent *** minutes providing this consultation,  from *** to ***. More than 50% of the time in this consultation was spent coordinating communication.   HISTORY OF PRESENT ILLNESS:  Kimberly Stafford is a 73 y.o. year old female with multiple medical problems including ***. Palliative Care was asked to help address goals of care.   CODE STATUS:   PPS: 0% HOSPICE ELIGIBILITY/DIAGNOSIS: TBD  PAST MEDICAL HISTORY:  Past Medical History:  Diagnosis Date  . Asthma   . COPD (chronic obstructive pulmonary disease) ([place])   . Diabetes mellitus without complication ([place])    states she had it for a while but "reversed it"  . Head injury   . Seizures ([place])   . Thyroid disease     SOCIAL HX:  Social History   Tobacco Use  . Smoking status: Former Research scientist (life sciences)  . Smokeless tobacco: Never Used  Substance Use Topics  . Alcohol use: Never    Frequency: Never    ALLERGIES: No Known Allergies   PERTINENT MEDICATIONS:  Outpatient Encounter Medications as of 12/17/2018  Medication Sig  . DULoxetine (CYMBALTA) 30 MG capsule Take 30 mg by mouth daily.  Marland Kitchen lamoTRIgine (LAMICTAL) 200 MG tablet Take 100 mg by mouth daily.   Marland Kitchen levothyroxine (SYNTHROID) 88 MCG tablet Take 88 mcg by mouth daily before breakfast.  . metoprolol succinate (TOPROL-XL) 25 MG 24 hr tablet Take 12.5 mg by mouth at bedtime.  . montelukast (SINGULAIR) 10 MG tablet Take 10 mg by mouth at bedtime.  Marland Kitchen QUEtiapine (SEROQUEL) 25 MG tablet Take 1/2 tablet every night for 1 week, then increase to 1 tablet every night and continue   No facility-administered encounter  medications on file as of 12/17/2018.     PHYSICAL EXAM:   General: NAD, frail appearing, thin Cardiovascular: regular rate and rhythm Pulmonary: clear ant fields Abdomen: soft, nontender, + bowel sounds GU: no suprapubic tenderness Extremities: no edema, no joint deformities Skin: no rashes Neurological: Weakness but otherwise nonfocal  Julianne Handler, NP

## 2018-12-18 ENCOUNTER — Telehealth: Payer: Self-pay

## 2018-12-18 NOTE — Telephone Encounter (Signed)
Per pts niece, [name] pt will be moving to memory care early next week and have a Hospice Consult. Pt has not been eating or drinking. Pt is dehydrated.

## 2018-12-21 ENCOUNTER — Encounter (HOSPITAL_COMMUNITY): Payer: Self-pay | Admitting: Emergency Medicine

## 2018-12-21 ENCOUNTER — Observation Stay (HOSPITAL_COMMUNITY)
Admission: EM | Admit: 2018-12-21 | Discharge: 2018-12-22 | Disposition: A | Discharge status: Home / Self Care | Payer: Medicare Other | Attending: Family Medicine | Admitting: Family Medicine

## 2018-12-21 ENCOUNTER — Telehealth: Payer: Self-pay | Admitting: Neurology

## 2018-12-21 ENCOUNTER — Other Ambulatory Visit: Payer: Self-pay

## 2018-12-21 DIAGNOSIS — S0990XA Unspecified injury of head, initial encounter: Secondary | ICD-10-CM | POA: Insufficient documentation

## 2018-12-21 DIAGNOSIS — Z66 Do not resuscitate: Secondary | ICD-10-CM | POA: Diagnosis not present

## 2018-12-21 DIAGNOSIS — Z20828 Contact with and (suspected) exposure to other viral communicable diseases: Secondary | ICD-10-CM | POA: Diagnosis not present

## 2018-12-21 DIAGNOSIS — Z79890 Hormone replacement therapy (postmenopausal): Secondary | ICD-10-CM | POA: Insufficient documentation

## 2018-12-21 DIAGNOSIS — Z79899 Other long term (current) drug therapy: Secondary | ICD-10-CM | POA: Insufficient documentation

## 2018-12-21 DIAGNOSIS — E876 Hypokalemia: Secondary | ICD-10-CM | POA: Diagnosis not present

## 2018-12-21 DIAGNOSIS — J449 Chronic obstructive pulmonary disease, unspecified: Secondary | ICD-10-CM | POA: Insufficient documentation

## 2018-12-21 DIAGNOSIS — G9341 Metabolic encephalopathy: Secondary | ICD-10-CM | POA: Insufficient documentation

## 2018-12-21 DIAGNOSIS — E870 Hyperosmolality and hypernatremia: Secondary | ICD-10-CM | POA: Diagnosis not present

## 2018-12-21 DIAGNOSIS — Z87891 Personal history of nicotine dependence: Secondary | ICD-10-CM | POA: Diagnosis not present

## 2018-12-21 DIAGNOSIS — D72829 Elevated white blood cell count, unspecified: Secondary | ICD-10-CM | POA: Insufficient documentation

## 2018-12-21 DIAGNOSIS — Z7401 Bed confinement status: Secondary | ICD-10-CM | POA: Diagnosis not present

## 2018-12-21 DIAGNOSIS — W19XXXA Unspecified fall, initial encounter: Secondary | ICD-10-CM | POA: Diagnosis not present

## 2018-12-21 DIAGNOSIS — E119 Type 2 diabetes mellitus without complications: Secondary | ICD-10-CM | POA: Insufficient documentation

## 2018-12-21 DIAGNOSIS — E079 Disorder of thyroid, unspecified: Secondary | ICD-10-CM | POA: Insufficient documentation

## 2018-12-21 DIAGNOSIS — F0390 Unspecified dementia without behavioral disturbance: Secondary | ICD-10-CM | POA: Diagnosis not present

## 2018-12-21 DIAGNOSIS — E860 Dehydration: Secondary | ICD-10-CM | POA: Insufficient documentation

## 2018-12-21 LAB — CBC
HCT: 47.7 % — ABNORMAL HIGH (ref 36.0–46.0)
Hemoglobin: 15.3 g/dL — ABNORMAL HIGH (ref 12.0–15.0)
MCH: 29.9 pg (ref 26.0–34.0)
MCHC: 32.1 g/dL (ref 30.0–36.0)
MCV: 93.2 fL (ref 80.0–100.0)
Platelets: 179 10*3/uL (ref 150–400)
RBC: 5.12 MIL/uL — ABNORMAL HIGH (ref 3.87–5.11)
RDW: 15.8 % — ABNORMAL HIGH (ref 11.5–15.5)
WBC: 12.1 10*3/uL — ABNORMAL HIGH (ref 4.0–10.5)
nRBC: 0 % (ref 0.0–0.2)

## 2018-12-21 LAB — SARS CORONAVIRUS 2 (TAT 6-24 HRS): SARS Coronavirus 2: NEGATIVE

## 2018-12-21 LAB — URINALYSIS, ROUTINE W REFLEX MICROSCOPIC
Glucose, UA: NEGATIVE mg/dL
Ketones, ur: 20 mg/dL — AB
Leukocytes,Ua: NEGATIVE
Nitrite: NEGATIVE
Protein, ur: 30 mg/dL — AB
Specific Gravity, Urine: 1.028 (ref 1.005–1.030)
pH: 5 (ref 5.0–8.0)

## 2018-12-21 LAB — COMPREHENSIVE METABOLIC PANEL
ALT: 30 U/L (ref 0–44)
AST: 29 U/L (ref 15–41)
Albumin: 3.6 g/dL (ref 3.5–5.0)
Alkaline Phosphatase: 73 U/L (ref 38–126)
Anion gap: 15 (ref 5–15)
BUN: 65 mg/dL — ABNORMAL HIGH (ref 8–23)
CO2: 22 mmol/L (ref 22–32)
Calcium: 9.3 mg/dL (ref 8.9–10.3)
Chloride: 122 mmol/L — ABNORMAL HIGH (ref 98–111)
Creatinine, Ser: 0.97 mg/dL (ref 0.44–1.00)
GFR calc Af Amer: >60 mL/min (ref >60)
GFR calc non Af Amer: 58 mL/min — ABNORMAL LOW (ref >60)
Glucose, Bld: 111 mg/dL — ABNORMAL HIGH (ref 70–99)
Potassium: 3.1 mmol/L — ABNORMAL LOW (ref 3.5–5.1)
Sodium: 159 mmol/L — ABNORMAL HIGH (ref 135–145)
Total Bilirubin: 2.3 mg/dL — ABNORMAL HIGH (ref 0.3–1.2)
Total Protein: 6.4 g/dL — ABNORMAL LOW (ref 6.5–8.1)

## 2018-12-21 MED ORDER — SODIUM CHLORIDE 0.9 % IV BOLUS
1000.0000 mL | Freq: Once | INTRAVENOUS | Status: AC
  Administered 2018-12-21: 1000 mL via INTRAVENOUS

## 2018-12-21 MED ORDER — LORAZEPAM 2 MG/ML IJ SOLN
0.5000 mg | INTRAMUSCULAR | Status: DC | PRN
  Administered 2018-12-21: 0.5 mg via INTRAVENOUS
  Filled 2018-12-21: qty 1

## 2018-12-21 MED ORDER — ONDANSETRON HCL 4 MG/2ML IJ SOLN: 4.0000 mg | Freq: Four times a day (QID) | INTRAMUSCULAR | Status: DC | PRN

## 2018-12-21 MED ORDER — ACETAMINOPHEN 650 MG RE SUPP: 650.0000 mg | Freq: Four times a day (QID) | RECTAL | Status: DC | PRN

## 2018-12-21 MED ORDER — BISACODYL 10 MG RE SUPP: 10.0000 mg | Freq: Every day | RECTAL | Status: DC | PRN

## 2018-12-21 MED ORDER — DEXTROSE 5 % IV SOLN
INTRAVENOUS | Status: DC
  Administered 2018-12-21: 21:00:00 via INTRAVENOUS

## 2018-12-21 MED ORDER — ONDANSETRON HCL 4 MG PO TABS: 4.0000 mg | ORAL_TABLET | Freq: Four times a day (QID) | ORAL | Status: DC | PRN

## 2018-12-21 MED ORDER — ALBUTEROL SULFATE (2.5 MG/3ML) 0.083% IN NEBU: 2.5000 mg | INHALATION_SOLUTION | RESPIRATORY_TRACT | Status: DC | PRN

## 2018-12-21 MED ORDER — MORPHINE SULFATE (PF) 2 MG/ML IV SOLN
2.0000 mg | INTRAVENOUS | Status: DC | PRN
  Administered 2018-12-21: 2 mg via INTRAVENOUS
  Filled 2018-12-21: qty 1

## 2018-12-21 MED ORDER — ACETAMINOPHEN 325 MG PO TABS: 650.0000 mg | ORAL_TABLET | Freq: Four times a day (QID) | ORAL | Status: DC | PRN

## 2018-12-21 NOTE — ED Notes (Signed)
Pt cardiac monitoring taken off per family request. Pt lights dimmed and warm blanket given; pt/family requesting to rest. Family at bedside.

## 2018-12-21 NOTE — ED Notes (Signed)
Attempt x2 straight stick to right AC without success. Main lab phlebotomy en route to draw labs.

## 2018-12-21 NOTE — Progress Notes (Signed)
[facility] - Hospice   Received request from [place] for family interest in hospice services [facility]. Chart reviewed and eligibility confirmed by [facility] physician. TOC manager aware.   Spoke with niece [name] by phone to confirm interest and discuss DME. No DME needs identified at this time.   Per [facility] manager plan is for patient to return to [facility] via [place]. [facility] referral center specialist will contact [name] to arrange first visit at facility.   Please send home with patient signed and completed DNR form if her code status is DNR.   Please do not hesitate to call with hospice related questions.   Thank you,  Erling Conte, LCSW [phone]

## 2018-12-21 NOTE — Telephone Encounter (Signed)
[name] patients daughter called in regarding [place] and wanting to see if Dr. Delice Lesch would be willing to send a Hospice order. She said that she is unable to eat or swallow. She has been falling more. Please Call [phone]. Thank you

## 2018-12-21 NOTE — ED Notes (Signed)
Pt automated right arm BP reading low; see 1419; BP site moved to right leg; see 1425.

## 2018-12-21 NOTE — ED Notes (Signed)
Case manager at bedside 

## 2018-12-21 NOTE — ED Notes (Signed)
Pt. Documented in error see above note in chart. 

## 2018-12-21 NOTE — Telephone Encounter (Signed)
Spoke with [employer]. Pt is at [facility] ED now. Pt's BP really low, keeps falling, not eating, abrasion to face. [name] believes the Dr. In ED will be helping with Hospice order. She will call if anything is needed.

## 2018-12-21 NOTE — ED Notes (Signed)
In and out completed to obtain urine sample. Brill RN at bedside to assist.

## 2018-12-21 NOTE — TOC Initial Note (Addendum)
Transition of Care [facility]) - Initial/Assessment Note    Patient Details  Name: Kimberly Stafford MRN: [id number] Date of Birth: 12-15-1945  Transition of Care [facility]) CM/SW Contact:    Erenest Rasher, RN Phone Number: [phone] 12/21/2018, 12:59 PM  Clinical Narrative:                  Spoke to pt's Niece, [name]. States pt is in the process of transitioning to Memory Care at ALF. Offered choice for Hospice, niece agreeable to [facility]. Contacted Harmon Pier, with [facility] with new referral. Hospice rep with contact niece to give information about Hospice Care at ALF.   Received call from Hospice rep and they will see pt tomorrow at [facility] and work with Facility to arrange DME. Will need DNR Gold Form to transport with pt back to facility.   Spoke to pt's niece at bedside. Explained that [facility] will be available tomorrow 12/22/2018. Pt will be go upstairs for obs or IP stay. Hospice RN will come out do admission at [facility] if she dc tomorrow.   Spoke to attending and she may need a few days IP prior to returning to facility. Made facility and Hospice aware.   Expected Discharge Plan: Assisted Living Barriers to Discharge: No Barriers Identified   Patient Goals and CMS Choice Patient states their goals for this hospitalization and ongoing recovery are:: requesting Hospice Care CMS Medicare.gov Compare Post Acute Care list provided to:: Patient Represenative (must comment)(neice, Quenten Raven) Choice offered to / list presented to : [place] / Guardian  Expected Discharge Plan and Services Expected Discharge Plan: Assisted Living   Discharge Planning Services: CM Consult Post Acute Care Choice: Hospice                                        Prior Living Arrangements/Services       Do you feel safe going back to the place where you live?: Yes      Need for Family Participation in Patient Care: Yes (Comment) Care giver  support system in place?: Yes (comment)   Criminal Activity/Legal Involvement Pertinent to Current Situation/Hospitalization: No - Comment as needed  Activities of Daily Living      Permission Sought/Granted   Permission granted to share information with : Yes, Verbal Permission Granted  Share Information with NAME: Norva Karvonen  Permission granted to share info w AGENCY: [facility]  Permission granted to share info w Relationship: Niece  Permission granted to share info w Contact Information: [phone]  Emotional Assessment     Affect (typically observed): Unable to Assess     Psych Involvement: No (comment)  Admission diagnosis:  failure to thrive fall There are no active problems to display for this patient.  PCP:  System, Pcp Not In Pharmacy:   [facility] [place], [place] - [address] [place] [address] Phone: [phone] Fax: [phone]  [place], [address] [place] [place] [address] Phone: [phone] Fax: [phone]     Social Determinants of Health (SDOH) Interventions    Readmission Risk Interventions No flowsheet data found.

## 2018-12-21 NOTE — H&P (Addendum)
[facility] History and Physical  Kimberly CookeySusan Popiel GNF:[id number] DOB: 06-28-1945 DOA: 12/21/2018   Limited history obtained due to patient's dementia.  Spoke with ED provider, niece of the patient and reviewed medical records.  Referring physician: ED  PCP: System, Pcp Not In   Chief Complaint: Poor oral intake  HPI: Kimberly CookeySusan Stafford is a 73 y.o. female with past medical history of COPD, diabetes, seizures, thyroid problem, asthma, dementia was brought into the hospital for poor oral intake for the last 3 to 4 days.  Patient sustained a fall today with head trauma but there was no clear-cut mention of whether there was loss of consciousness involved.  There is no mention of fever cough chills or rigor.  No mention of nausea, vomiting diarrhea but patient has been barely eating for several days now.  Patient has been having a progressive cognitive  and physical decline at the assisted living facility where she is currently.  Patient's niece at bedside providing history regarding the patient.  Patient has had recent few falls as well.  Recent history of UTI.  Patient is incontinent.  Has been more bedbound and requiring more help.  Patient confused and disoriented and unable to provide much information.  ED Course: In the ED, patient leukocytosis with hemoconcentration, hypokalemia at 3.1, BUN elevated at 65 with a creatinine of 0.9 and sodium was elevated at 159.  He was mildly tachycardic and hypotensive on presentation.  Subsequently, remained hypotensive.  ED provider had prolonged conversation with the patient's niece at bedside and had discussed about DNR and comfort measures.  Hospitalist team was then consulted for admission to the hospital.  Review of Systems:  Limited due to patient's condition.  Past Medical History:  Diagnosis Date  . Asthma   . COPD (chronic obstructive pulmonary disease) (HCC)   . Diabetes mellitus without complication (HCC)    states she had it for a while but  "reversed it"  . Head injury   . Seizures (HCC)   . Thyroid disease    Past Surgical History:  Procedure Laterality Date  . ABDOMINAL HYSTERECTOMY    . BACK SURGERY    . CHOLECYSTECTOMY      Social History:  reports that she has quit smoking. She has never used smokeless tobacco. She reports that she does not drink alcohol or use drugs.  No Known Allergies  Family History  Problem Relation Age of Onset  . COPD Mother 2660  . Diabetes Father 7576  . Diabetes Brother      Prior to Admission medications   Medication Sig Start Date End Date Taking? Authorizing Provider  DULoxetine (CYMBALTA) 30 MG capsule Take 30 mg by mouth daily.    [provider]  lamoTRIgine (LAMICTAL) 200 MG tablet Take 100 mg by mouth daily.     [provider]  levothyroxine (SYNTHROID) 88 MCG tablet Take 88 mcg by mouth daily before breakfast.    [provider]  metoprolol succinate (TOPROL-XL) 25 MG 24 hr tablet Take 25 mg by mouth at bedtime.     [provider]  montelukast (SINGULAIR) 10 MG tablet Take 10 mg by mouth at bedtime.    [provider]  PRESCRIPTION MEDICATION Take 1 Container by mouth 2 (two) times daily. Magic cup for supplement    [provider]  QUEtiapine (SEROQUEL) 25 MG tablet Take 1/2 tablet every night for 1 week, then increase to 1 tablet every night and continue Patient taking differently: Take 25 mg by  mouth at bedtime.  12/02/18   Cameron Sprang, MD    Physical Exam: Vitals:   12/21/18 1419 12/21/18 1425 12/21/18 1548 12/21/18 1630  BP: (!) 87/66 (!) 119/92 (!) 147/80 (!) 132/104  Pulse:  (!) 114 93 94  Resp:  20 17 18   Temp:      TempSrc:      SpO2:  100% 100% 100%  Weight:      Height:       Wt Readings from Last 3 Encounters:  12/21/18 70.3 kg  12/17/18 70.3 kg  11/27/18 70.3 kg   Body mass index is 29.28 kg/m.  General: Chronically ill, deconditioned disoriented and confused. HENT: Normocephalic, pupils  equally reacting to light and accommodation.  No scleral pallor or icterus noted. Oral mucosa is dry.  Left forehead abrasion. Chest:  Clear breath sounds.  Diminished breath sounds bilaterally. No crackles or wheezes.  CVS: S1 &S2 heard. No murmur.  Regular rate and rhythm. Abdomen: Soft, nontender, nondistended.   Extremities: No cyanosis, clubbing or edema.  Peripheral pulses are palpable. Psych: Confused disoriented but alert awake CNS:  No cranial nerve deficits.  Power equal in all extremities.   No cerebellar signs.   Skin: Warm and dry.  No rashes noted.  Decreased skin turgor.  Labs on Admission:   CBC: Recent Labs  Lab 12/17/18 1435 12/21/18 1140  WBC 12.1* 12.1*  NEUTROABS 10.4*  --   HGB 16.2* 15.3*  HCT 50.1* 47.7*  MCV 90.3 93.2  PLT 310 664    Basic Metabolic Panel: Recent Labs  Lab 12/17/18 1435 12/21/18 1140  NA 149* 159*  K 3.1* 3.1*  CL 108 122*  CO2 23 22  GLUCOSE 112* 111*  BUN 58* 65*  CREATININE 0.96 0.97  CALCIUM 10.0 9.3    Liver Function Tests: Recent Labs  Lab 12/17/18 1435 12/21/18 1140  AST 26 29  ALT 22 30  ALKPHOS 78 73  BILITOT 1.8* 2.3*  PROT 7.2 6.4*  ALBUMIN 4.2 3.6   Recent Labs  Lab 12/17/18 1435  LIPASE 46   No results for input(s): AMMONIA in the last 168 hours.  Cardiac Enzymes: No results for input(s): CKTOTAL, CKMB, CKMBINDEX, TROPONINI in the last 168 hours.  BNP (last 3 results) No results for input(s): BNP in the last 8760 hours.  ProBNP (last 3 results) No results for input(s): PROBNP in the last 8760 hours.  CBG: No results for input(s): GLUCAP in the last 168 hours.  Lipase     Component Value Date/Time   LIPASE 46 12/17/2018 1435     Urinalysis    Component Value Date/Time   COLORURINE AMBER (A) 12/21/2018 1220   APPEARANCEUR HAZY (A) 12/21/2018 1220   LABSPEC 1.028 12/21/2018 1220   PHURINE 5.0 12/21/2018 1220   GLUCOSEU NEGATIVE 12/21/2018 1220   HGBUR MODERATE (A) 12/21/2018 1220    BILIRUBINUR SMALL (A) 12/21/2018 1220   KETONESUR 20 (A) 12/21/2018 1220   PROTEINUR 30 (A) 12/21/2018 1220   NITRITE NEGATIVE 12/21/2018 1220   LEUKOCYTESUR NEGATIVE 12/21/2018 1220     Drugs of Abuse  No results found for: LABOPIA, COCAINSCRNUR, LABBENZ, AMPHETMU, THCU, LABBARB    Radiological Exams on Admission: No results found.  EKG: Personally reviewed by me which shows normal sinus rhythm.  Assessment/Plan  Principal Problem:   Fall Active Problems:   Hypernatremia   Dehydration   Hypokalemia   Dementia (HCC)   Diabetes (HCC)  Severe hypernatremia likely secondary to poor oral  intake and dehydration secondary to dementia.  Poor overall prognosis.  Continue with D5 water overnight for comfort reasons only.  Patient received a 1 L of normal saline in the ED. no further labs in a.m.  Metabolic encephalopathy on the background of dementia likely secondary to severe hyponatremia and volume depletion.  Unwitnessed fall with head trauma.  Patient's family not interested in imaging.  Pain relief if needed.  Mild leukocytosis, elevated hematocrit.  Likely secondary to dehydration.  Continue with IV fluid hydration.  Could consider discontinuation of fluids in a.m.  Hypokalemia.  Mild.  Will not replenish.  Comfort measures   Dementia.  Progressive decline.  Comfort measures only.  Abnormal urinalysis with ketones and bacteria.  Likely secondary to poor oral intake.   COPD.  Continue oxygen nebulizer as needed  Diabetes mellitus type 2.  Poor oral intake.  No Accu-Cheks or blood draws.  Pleasure feeds if desired.  Patient likes ice cream.  Debility, deconditioning progressive decline.  Comfort measures.  DVT Prophylaxis: None for comfort  Consultant: None  Code Status: DNR  Microbiology none  Antibiotics: None  Family Communication:  Patients' condition and plan of care including tests being ordered have been discussed with the patient's niece who is also the  health power of attorney who indicate understanding and agree with the plan.  Disposition Plan: I had a prolonged discussion with the patient's and niece who is also there power of attorney at bedside.  Discussed about palliative care/ comfort care and hospice.  Patient's niece strongly wishes the patient to be comfortable without pain or anxiety and does not wish her to be on any extraordinary measures to sustain her life.  After thorough discussion, comfort measures will be initiated..  Could consider memory care unit with hospice if prolonged.  Severity of Illness: The appropriate patient status for this patient is INPATIENT. Inpatient status is judged to be reasonable and necessary in order to provide the required intensity of service to ensure the patient's safety. The patient's presenting symptoms, physical exam findings, and initial radiographic and laboratory data in the context of their chronic comorbidities is felt to place them at high risk for further clinical deterioration. Furthermore, it is not anticipated that the patient will be medically stable for discharge from the hospital within 2 midnights of admission. The following factors support the patient status of inpatient.  I certify that at the point of admission it is my clinical judgment that the patient will require inpatient hospital care spanning beyond 2 midnights from the point of admission due to high intensity of service, high risk for further deterioration and high frequency of surveillance required.   Signed, Joycelyn Das, MD [facility] 12/21/2018

## 2018-12-21 NOTE — ED Provider Notes (Addendum)
[facility] Emergency Department Provider Note MRN:  [id number]  Arrival date & time: 12/21/18     Chief Complaint   Fall   History of Present Illness   Kimberly Stafford is a 73 y.o. year-old female with a history of dementia, diabetes, COPD presenting to the ED with chief complaint of fall.  Patient has not been eating well for the past 3 days at care facility.  Fall this morning, head trauma, unknown if loss of consciousness.  I was unable to obtain an accurate HPI, PMH, or ROS due to the patient's dementia.  Level 5 caveat.  Review of Systems  Positive for fall, poor appetite.  Patient's Health History    Past Medical History:  Diagnosis Date  . Asthma   . COPD (chronic obstructive pulmonary disease) ([place])   . Diabetes mellitus without complication ([place])    states she had it for a while but "reversed it"  . Head injury   . Seizures ([place])   . Thyroid disease     Past Surgical History:  Procedure Laterality Date  . ABDOMINAL HYSTERECTOMY    . BACK SURGERY    . CHOLECYSTECTOMY      Family History  Problem Relation Age of Onset  . COPD Mother 51  . Diabetes Father 70  . Diabetes Brother     Social History   Socioeconomic History  . Marital status: Single    Spouse name: Not on file  . Number of children: Not on file  . Years of education: Not on file  . Highest education level: Not on file  Occupational History  . Not on file  Social Needs  . Financial resource strain: Not on file  . Food insecurity    Worry: Not on file    Inability: Not on file  . Transportation needs    Medical: Not on file    Non-medical: Not on file  Tobacco Use  . Smoking status: Former Research scientist (life sciences)  . Smokeless tobacco: Never Used  Substance and Sexual Activity  . Alcohol use: Never    Frequency: Never  . Drug use: Never  . Sexual activity: Not Currently  Lifestyle  . Physical activity    Days per week: Not on file    Minutes per session: Not on file  .  Stress: Not on file  Relationships  . Social Herbalist on phone: Not on file    Gets together: Not on file    Attends religious service: Not on file    Active member of club or organization: Not on file    Attends meetings of clubs or organizations: Not on file    Relationship status: Not on file  . Intimate partner violence    Fear of current or ex partner: Not on file    Emotionally abused: Not on file    Physically abused: Not on file    Forced sexual activity: Not on file  Other Topics Concern  . Not on file  Social History Narrative   Master's Degree     Physical Exam  Vital Signs and Nursing Notes reviewed Vitals:   12/21/18 1419 12/21/18 1425  BP: (!) 87/66 (!) 119/92  Pulse:  (!) 114  Resp:  20  Temp:    SpO2:  100%    CONSTITUTIONAL: Chronically ill-appearing, NAD NEURO: Oriented to name only, no focal deficits EYES:  eyes equal and reactive ENT/NECK:  no LAD, no JVD CARDIO: Regular rate, well-perfused, normal  S1 and S2 PULM:  CTAB no wheezing or rhonchi GI/GU:  normal bowel sounds, non-distended, non-tender MSK/SPINE:  No gross deformities, no edema SKIN: Abrasion to left forehead PSYCH:  Appropriate speech and behavior  Diagnostic and Interventional Summary    EKG Interpretation  Date/Time:  Monday December 21 2018 10:24:04 EST Ventricular Rate:  97 PR Interval:    QRS Duration: 84 QT Interval:  483 QTC Calculation: 614 R Axis:   56 Text Interpretation: Sinus rhythm Low voltage, extremity leads Confirmed by Virgina Norfolk [phone]) on 12/21/2018 10:33:02 AM      Labs Reviewed  CBC - Abnormal; Notable for the following components:      Result Value   WBC 12.1 (*)    RBC 5.12 (*)    Hemoglobin 15.3 (*)    HCT 47.7 (*)    RDW 15.8 (*)    All other components within normal limits  COMPREHENSIVE METABOLIC PANEL - Abnormal; Notable for the following components:   Sodium 159 (*)    Potassium 3.1 (*)    Chloride 122 (*)    Glucose, Bld  111 (*)    BUN 65 (*)    Total Protein 6.4 (*)    Total Bilirubin 2.3 (*)    GFR calc non Af Amer 58 (*)    All other components within normal limits  URINALYSIS, ROUTINE W REFLEX MICROSCOPIC - Abnormal; Notable for the following components:   Color, Urine AMBER (*)    APPearance HAZY (*)    Hgb urine dipstick MODERATE (*)    Bilirubin Urine SMALL (*)    Ketones, ur 20 (*)    Protein, ur 30 (*)    Bacteria, UA MANY (*)    All other components within normal limits    No orders to display    Medications - No data to display   Procedures  /  Critical Care Procedures  ED Course and Medical Decision Making  I have reviewed the triage vital signs and the nursing notes.  Pertinent labs & imaging results that were available during my care of the patient were reviewed by me and considered in my medical decision making (see below for details).     Unwitnessed fall, evidence of trauma, considering intracranial bleeding, cervical spinal injury.  Poor appetite, also considering metabolic disarray, AKI, dehydration.  I was able to discuss goals of care with patient's niece and power of attorney.  Patient is interested in full comfort measures.  I explained this in detail, how with full comfort measures we will not pursue laboratory evaluation, no imaging, let patient live the rest of her life and die naturally.  This is the patient's wish.  They need assistance transitioning her care from assisted living to memory unit and/or hospice.  She explains that they had issue getting this done last week when they were here.  Will reconsult case management.  We agreed to check basic labs to see if patient warrants admission, which may expedite the placement process.  Patient is not interested in neurosurgical management and so imaging deferred at this time.  Labs reveal hypernatremia.  Suspect dehydration related.  Awaiting case management to confirm that patient will be able to return to her care facility  with increased care.  Signed out to oncoming provider at shift change.  If unable to go back to her facility with 24-hour assistance, could admit to hospitalist service.  315pm update:  Unable to secure full assistance at facility until tomorrow morning, will admit  for hypernatremia.  Elmer SowMichael M. Pilar PlateBero, MD [facility] [email or web address]  Final Clinical Impressions(s) / ED Diagnoses     ICD-10-CM   1. Fall, initial encounter  W19.XXXA   2. Hypernatremia  E87.0   3. Dehydration  E86.0     ED Discharge Orders    None       Discharge Instructions Discussed with and Provided to Patient:   Discharge Instructions   None       Sabas SousBero, Michael M, MD 12/21/18 1434    Sabas SousBero, Michael M, MD 12/21/18 1515

## 2018-12-21 NOTE — ED Triage Notes (Addendum)
[facility] ALF sent pt EMS for fall this am; pt has hematoma to left brow; pt also has bruising to left arm from yesterday. Pt hx of dementia; disoriented x4.   Pt has not not eaten or drank anything for 3 days; IV initiated with EMS and pt given 500 ml NS bolus en route to hospital.

## 2018-12-22 ENCOUNTER — Encounter: Payer: Self-pay | Admitting: Internal Medicine

## 2018-12-22 ENCOUNTER — Non-Acute Institutional Stay: Payer: Medicare Other | Admitting: Internal Medicine

## 2018-12-22 DIAGNOSIS — E860 Dehydration: Secondary | ICD-10-CM | POA: Diagnosis not present

## 2018-12-22 DIAGNOSIS — Z515 Encounter for palliative care: Secondary | ICD-10-CM

## 2018-12-22 DIAGNOSIS — W19XXXA Unspecified fall, initial encounter: Secondary | ICD-10-CM

## 2018-12-22 NOTE — Care Management CC44 (Signed)
Condition Code 44 Documentation Completed  Patient Details  Name: Kimberly Stafford MRN: [id number] Date of Birth: 1945/08/12   Condition Code 44 given:  Yes Patient signature on Condition Code 44 notice:  Yes Documentation of 2 MD's agreement:  Yes Code 44 added to claim:  Yes    Lia Hopping, LCSW 12/22/2018, 4:40 PM

## 2018-12-22 NOTE — Progress Notes (Signed)
   12/22/18 0900  Clinical Encounter Type  Visited With Family;Other (Comment) (Niece [place])  Visit Type Initial;Psychological support;Spiritual support  Referral From Chaplain;Family  Consult/Referral To Chaplain  Spiritual Encounters  Spiritual Needs Emotional;Other (Comment) (Spiritual Care Conversation/Support)  Stress Factors  Family Stress Factors Health changes;Major life changes;Other (Comment)    I spoke with Mrs. Geers niece, [name], this morning per referral from another Chaplain in the community. [name] was less anxious today, knowing that her aunt's wishes to get a hospice evaluation would be fulfilled this afternoon at [facility] when she returns to their memory care facility.  I provided Spiritual Care support and no needs were identified.    Chaplain Shanon Ace M.Div., [facility]

## 2018-12-22 NOTE — NC FL2 (Addendum)
  [place] LEVEL OF CARE SCREENING TOOL     IDENTIFICATION  Patient Name: Kimberly Stafford Birthdate: 08-Nov-1945 Sex: female Admission Date (Current Location): 12/21/2018  [facility] and [place] Number:  Herbalist and Address:  [facility],  [place] [address], [place]      Provider Number: [id number]  Attending Physician Name and Address:  Darliss Cheney, MD  Relative Name and Phone Number:  [name] (Niece) [phone]    Current Level of Care: Hospital Recommended Level of Care: Memory Care Prior Approval Number:    Date Approved/Denied:   PASRR Number:    Discharge Plan: Other (Comment)(Memory)    Current Diagnoses: Patient Active Problem List   Diagnosis Date Noted  . Hypernatremia 12/21/2018  . Dehydration 12/21/2018  . Fall 12/21/2018  . Hypokalemia 12/21/2018  . Dementia ([place]) 12/21/2018  . Diabetes ([place]) 12/21/2018    Orientation RESPIRATION BLADDER Height & Weight     Self  Normal Incontinent Weight: 154 lb 15.7 oz (70.3 kg)(as recorded 4 days ago) Height:  5\' 1"  (154.9 cm)(as recorded 4 days ago)  BEHAVIORAL SYMPTOMS/MOOD NEUROLOGICAL BOWEL NUTRITION STATUS      Incontinent Diet([facility])  AMBULATORY STATUS COMMUNICATION OF NEEDS Skin   Extensive Assist Verbally Skin abrasions, Bruising                       Personal Care Assistance Level of Assistance  Bathing, Feeding, Dressing Bathing Assistance: Maximum assistance Feeding assistance: Maximum assistance Dressing Assistance: Maximum assistance     Functional Limitations Info  Sight, Hearing, Speech Sight Info: Adequate Hearing Info: Adequate Speech Info: Adequate    SPECIAL CARE FACTORS FREQUENCY                       Contractures Contractures Info: Not present    Additional Factors Info  Code Status, Allergies Code Status Info:DNR Allergies Info: NKA           Current Medications (12/22/2018):  This is the current  hospital active medication list   Discharge Medications: Medication List    TAKE these medications   DULoxetine 30 MG capsule Commonly known as: CYMBALTA Take 30 mg by mouth daily.   lamoTRIgine 200 MG tablet Commonly known as: LAMICTAL Take 200 mg by mouth daily.   levothyroxine 88 MCG tablet Commonly known as: SYNTHROID Take 88 mcg by mouth daily before breakfast.   metoprolol succinate 25 MG 24 hr tablet Commonly known as: TOPROL-XL Take 25 mg by mouth daily.   montelukast 10 MG tablet Commonly known as: SINGULAIR Take 10 mg by mouth at bedtime.   NUTRITIONAL SUPPLEMENT PO Take by mouth. Magic Cup bid   QUEtiapine 25 MG tablet Commonly known as: SEROquel Take 1/2 tablet every night for 1 week, then increase to 1 tablet every night and continue What changed:   how much to take  how to take this  when to take this  additional instructions         Follow-up Information    [facility] Follow up.   Specialty: PALLIATIVE CARE Contact information: [place] [phone]      Relevant Imaging Results:  Relevant Lab Results:   Additional Information SS# [id number]  Lia Hopping, LCSW

## 2018-12-22 NOTE — TOC Transition Note (Addendum)
Transition of Care [facility]) - CM/SW Discharge Note   Patient Details  Name: Ruwayda Curet MRN: [id number] Date of Birth: 1945/09/23  Transition of Care [facility]) CM/SW Contact:  Lia Hopping, [place] Phone Number: 12/22/2018, 1:03 PM   Clinical Narrative:    CSW reached out to the niece. CSW discussed patient ability to return to [facility]. The facility has arranged a space for the patient in [place] and [facility] has planned an evaluation with the patient later today.  CSW notified the physician. Physician will reach out to the niece.  CSW reached out to [name] to coordinate the patient discharge.   Fl2 and COVID-19 test faxed to:[phone] ([facility]) [facility] arranged for transport Nurse call report to room [phone]   Final next level of care: Memory Care Barriers to Discharge: Barriers Resolved   Patient Goals and CMS Choice Patient states their goals for this hospitalization and ongoing recovery are:: requesting Hospice Care CMS Medicare.gov Compare Post Acute Care list provided to:: Patient Represenative (must comment)(neice, Quenten Raven) Choice offered to / list presented to : [place] / [place]  Discharge Placement                Patient to be transferred to facility by: [place] Name of family member notified: [name] Niece   [phone] Patient and family notified of of transfer: 12/22/18  Discharge Plan and Services   Discharge Planning Services: CM Consult Post Acute Care Choice: Hospice                               Social Determinants of Health (SDOH) Interventions     Readmission Risk Interventions No flowsheet data found.

## 2018-12-22 NOTE — Discharge Summary (Signed)
Physician Discharge Summary  Kimberly Stafford VZC:[id number] DOB: [birth year] DOA: 12/21/2018  PCP: Georgann Housekeeper, MD  Admit date: 12/21/2018 Discharge date: 12/22/2018  Admitted From: Assisted living facility Disposition: Memory care unit with hospice  Recommendations for Outpatient Follow-up:  1. Follow up with PCP in 1-2 weeks 2. Please obtain BMP/CBC in one week 3. Please follow up on the following pending results:  Home Health: None Equipment/Devices: None  Discharge Condition: Guarded CODE STATUS: DNR Diet recommendation: Regular  Subjective: Seen and examined.  Patient remains confused but pleasant.  HPI: Kimberly Stafford is a 73 y.o. female with past medical history of COPD, diabetes, seizures, thyroid problem, asthma, dementia was brought into the hospital for poor oral intake for the last 3 to 4 days.  Patient sustained a fall today with head trauma but there was no clear-cut mention of whether there was loss of consciousness involved.  There is no mention of fever cough chills or rigor.  No mention of nausea, vomiting diarrhea but patient has been barely eating for several days now.  Patient has been having a progressive cognitive  and physical decline at the assisted living facility where she is currently.  Patient's niece at bedside providing history regarding the patient.  Patient has had recent few falls as well.  Recent history of UTI.  Patient is incontinent.  Has been more bedbound and requiring more help.  Patient confused and disoriented and unable to provide much information.  ED Course: In the ED, patient leukocytosis with hemoconcentration, hypokalemia at 3.1, BUN elevated at 65 with a creatinine of 0.9 and sodium was elevated at 159.  He was mildly tachycardic and hypotensive on presentation.  Subsequently, remained hypotensive.  ED provider had prolonged conversation with the patient's niece at bedside and had discussed about DNR and comfort measures.  Hospitalist team was  then consulted for admission to the hospital.  Brief/Interim Summary: Patient was admitted under hospital service for comfort care.  Arrangements were made for the patient to see hospice at her memory care unit.  I called and discussed with patient's niece Keshonda who is in agreement with the discharge plan.  Discharge Diagnoses:  Principal Problem:   Fall Active Problems:   Hypernatremia   Dehydration   Hypokalemia   Dementia (HCC)   Diabetes [facility])    Discharge Instructions  Discharge Instructions    Discharge patient   Complete by: As directed    Discharge disposition: 04-Intermediate Care Facility   Discharge patient date: 12/22/2018     Allergies as of 12/22/2018   No Known Allergies     Medication List    TAKE these medications   DULoxetine 30 MG capsule Commonly known as: CYMBALTA Take 30 mg by mouth daily.   lamoTRIgine 200 MG tablet Commonly known as: LAMICTAL Take 200 mg by mouth daily.   levothyroxine 88 MCG tablet Commonly known as: SYNTHROID Take 88 mcg by mouth daily before breakfast.   metoprolol succinate 25 MG 24 hr tablet Commonly known as: TOPROL-XL Take 25 mg by mouth daily.   montelukast 10 MG tablet Commonly known as: SINGULAIR Take 10 mg by mouth at bedtime.   NUTRITIONAL SUPPLEMENT PO Take by mouth. Magic Cup bid   QUEtiapine 25 MG tablet Commonly known as: SEROquel Take 1/2 tablet every night for 1 week, then increase to 1 tablet every night and continue What changed:   how much to take  how to take this  when to take this  additional instructions  Follow-up Information    [facility] Follow up.   Specialty: PALLIATIVE CARE Contact information: [place] [phone]         No Known Allergies  Consultations: None   Procedures/Studies: Ct Head Wo Contrast  Result Date: 12/17/2018 CLINICAL DATA:  Altered LOC EXAM: CT HEAD WITHOUT CONTRAST TECHNIQUE:  Contiguous axial images were obtained from the base of the skull through the vertex without intravenous contrast. COMPARISON:  CT brain 12/03/2018 FINDINGS: Brain: No acute territorial infarction, hemorrhage or intracranial mass. Mild atrophy and small vessel ischemic changes of the white matter. Stable ventricle size. Vascular: No hyperdense vessels.  Scattered carotid calcifications. Skull: Normal. Negative for fracture or focal lesion. Sinuses/Orbits: No acute finding. Other: None IMPRESSION: 1. No CT evidence for acute intracranial abnormality. 2. Mild atrophy and small vessel ischemic changes of the white matter. Electronically Signed   By: Donavan Foil M.D.   On: 12/17/2018 15:23   Ct Head Wo Contrast  Result Date: 12/03/2018 CLINICAL DATA:  Head trauma, ataxia, unwitnessed fall EXAM: CT HEAD WITHOUT CONTRAST CT CERVICAL SPINE WITHOUT CONTRAST TECHNIQUE: Multidetector CT imaging of the head and cervical spine was performed following the standard protocol without intravenous contrast. Multiplanar CT image reconstructions of the cervical spine were also generated. COMPARISON:  CT 10/31/2018 FINDINGS: CT HEAD FINDINGS Brain: No evidence of acute infarction, hemorrhage, hydrocephalus, extra-axial collection or mass lesion/mass effect. Symmetric prominence of the ventricles, cisterns and sulci compatible with parenchymal volume loss. Patchy areas of white matter hypoattenuation are most compatible with chronic microvascular angiopathy. Vascular: Atherosclerotic calcification of the carotid siphons and intradural vertebral arteries. No hyperdense vessel. Skull: Mild left supraorbital soft tissue thickening. No subjacent hematoma or calvarial fracture. No suspicious osseous lesions. Hyperostosis frontalis interna is noted incidentally Sinuses/Orbits: Small amount of fluid layering in the sphenoid sinus, similar to prior. Remaining paranasal sinuses are predominantly clear. Mastoid air cells and middle ear  cavities are clear. Other: None CT CERVICAL SPINE FINDINGS Alignment: Preservation of the normal cervical lordosis. Mild dextrocurvature of the cervical spine possibly positional with stabilization collar in place. Slight anterolisthesis of C4 on C5 and retrolisthesis C5 on C6 is on a degenerative basis, unchanged from comparison study. No abnormal facet widening. Craniocervical and atlantoaxial articulations are normally aligned. Skull base and vertebrae: No acute fracture. No primary bone lesion or focal pathologic process. Soft tissues and spinal canal: No pre or paravertebral fluid or swelling. No visible canal hematoma. Disc levels: Multilevel intervertebral disc height loss with spondylitic endplate changes. Mild to moderate facet hypertrophic changes are noted as well. Findings are maximal at C5-6 and C6-7. At most mild multilevel neural foraminal narrowing. No significant canal stenosis. Upper chest: Apical pleuroparenchymal changes are noted. Other: Mild cervical carotid atherosclerosis. Included portions of the thyroid are unremarkable. IMPRESSION: 1. No acute intracranial abnormality. 2. Stable parenchymal volume loss and chronic microvascular ischemic white matter disease. 3. No acute cervical spine fracture or traumatic listhesis. 4. Stable multilevel degenerative changes of the cervical spine, detailed above. Electronically Signed   By: Lovena Le M.D.   On: 12/03/2018 19:57   Ct Cervical Spine Wo Contrast  Result Date: 12/03/2018 CLINICAL DATA:  Head trauma, ataxia, unwitnessed fall EXAM: CT HEAD WITHOUT CONTRAST CT CERVICAL SPINE WITHOUT CONTRAST TECHNIQUE: Multidetector CT imaging of the head and cervical spine was performed following the standard protocol without intravenous contrast. Multiplanar CT image reconstructions of the cervical spine were also generated. COMPARISON:  CT 10/31/2018 FINDINGS: CT HEAD  FINDINGS Brain: No evidence of acute infarction, hemorrhage, hydrocephalus,  extra-axial collection or mass lesion/mass effect. Symmetric prominence of the ventricles, cisterns and sulci compatible with parenchymal volume loss. Patchy areas of white matter hypoattenuation are most compatible with chronic microvascular angiopathy. Vascular: Atherosclerotic calcification of the carotid siphons and intradural vertebral arteries. No hyperdense vessel. Skull: Mild left supraorbital soft tissue thickening. No subjacent hematoma or calvarial fracture. No suspicious osseous lesions. Hyperostosis frontalis interna is noted incidentally Sinuses/Orbits: Small amount of fluid layering in the sphenoid sinus, similar to prior. Remaining paranasal sinuses are predominantly clear. Mastoid air cells and middle ear cavities are clear. Other: None CT CERVICAL SPINE FINDINGS Alignment: Preservation of the normal cervical lordosis. Mild dextrocurvature of the cervical spine possibly positional with stabilization collar in place. Slight anterolisthesis of C4 on C5 and retrolisthesis C5 on C6 is on a degenerative basis, unchanged from comparison study. No abnormal facet widening. Craniocervical and atlantoaxial articulations are normally aligned. Skull base and vertebrae: No acute fracture. No primary bone lesion or focal pathologic process. Soft tissues and spinal canal: No pre or paravertebral fluid or swelling. No visible canal hematoma. Disc levels: Multilevel intervertebral disc height loss with spondylitic endplate changes. Mild to moderate facet hypertrophic changes are noted as well. Findings are maximal at C5-6 and C6-7. At most mild multilevel neural foraminal narrowing. No significant canal stenosis. Upper chest: Apical pleuroparenchymal changes are noted. Other: Mild cervical carotid atherosclerosis. Included portions of the thyroid are unremarkable. IMPRESSION: 1. No acute intracranial abnormality. 2. Stable parenchymal volume loss and chronic microvascular ischemic white matter disease. 3. No acute  cervical spine fracture or traumatic listhesis. 4. Stable multilevel degenerative changes of the cervical spine, detailed above. Electronically Signed   By: Kreg Shropshire M.D.   On: 12/03/2018 19:57   Dg Chest Portable 1 View  Result Date: 12/17/2018 CLINICAL DATA:  Altered mental status EXAM: PORTABLE CHEST 1 VIEW COMPARISON:  Chest radiograph dated 10/31/2018 FINDINGS: The patient is rotated to the right. The heart size is normal. The mediastinal contours are normal given patient positioning. The lungs are clear. The osseous structures are intact. IMPRESSION: No active disease. Electronically Signed   By: Romona Curls M.D.   On: 12/17/2018 13:54      Discharge Exam: Vitals:   12/21/18 2144 12/22/18 0604  BP: 127/73 117/80  Pulse: 89 90  Resp: 19 18  Temp: (!) 97.5 F (36.4 C) 98.1 F (36.7 C)  SpO2: 100% 100%   Vitals:   12/21/18 1800 12/21/18 1930 12/21/18 2144 12/22/18 0604  BP: 140/85 (!) 151/93 127/73 117/80  Pulse: 81 84 89 90  Resp: Temp:   (!) 97.5 F (36.4 C) 98.1 F (36.7 C)  TempSrc:    Oral  SpO2: 100% 99% 100% 100%  Weight:      Height:        General: Pt is confused Cardiovascular: RRR, S1/S2 +, no rubs, no gallops Respiratory: CTA bilaterally, no wheezing, no rhonchi Abdominal: Soft, NT, ND, bowel sounds + Extremities: no edema, no cyanosis    The results of significant diagnostics from this hospitalization (including imaging, microbiology, ancillary and laboratory) are listed below for reference.     Microbiology: Recent Results (from the past 240 hour(s))  SARS CORONAVIRUS 2 (TAT 6-24 HRS) Nasopharyngeal Nasopharyngeal Swab     Status: None   Collection Time: 12/21/18  3:46 PM   Specimen: Nasopharyngeal Swab  Result Value Ref Range Status   SARS Coronavirus 2  NEGATIVE NEGATIVE Final    Comment: (NOTE) SARS-CoV-2 target nucleic acids are NOT DETECTED. The SARS-CoV-2 RNA is generally detectable in upper and lower respiratory  specimens during the acute phase of infection. Negative results do not preclude SARS-CoV-2 infection, do not rule out co-infections with other pathogens, and should not be used as the sole basis for treatment or other patient management decisions. Negative results must be combined with clinical observations, patient history, and epidemiological information. The expected result is Negative. Fact Sheet for Patients: HairSlick.nohttps://www.fda.gov/media/138098/download Fact Sheet for Healthcare Providers: quierodirigir.comhttps://www.fda.gov/media/138095/download This test is not yet approved or cleared by the [place] FDA and  has been authorized for detection and/or diagnosis of SARS-CoV-2 by FDA under an Emergency Use Authorization (EUA). This EUA will remain  in effect (meaning this test can be used) for the duration of the COVID-19 declaration under Section 56 4(b)(1) of the Act, 21 U.S.C. section 360bbb-3(b)(1), unless the authorization is terminated or revoked sooner. Performed at [facility], [address] [place], [place] [address]      Labs: BNP (last 3 results) No results for input(s): BNP in the last 8760 hours. Basic Metabolic Panel: Recent Labs  Lab 12/17/18 1435 12/21/18 1140  NA 149* 159*  K 3.1* 3.1*  CL 108 122*  CO2 23 22  GLUCOSE 112* 111*  BUN 58* 65*  CREATININE 0.96 0.97  CALCIUM 10.0 9.3   Liver Function Tests: Recent Labs  Lab 12/17/18 1435 12/21/18 1140  AST 26 29  ALT 22 30  ALKPHOS 78 73  BILITOT 1.8* 2.3*  PROT 7.2 6.4*  ALBUMIN 4.2 3.6   Recent Labs  Lab 12/17/18 1435  LIPASE 46   No results for input(s): AMMONIA in the last 168 hours. CBC: Recent Labs  Lab 12/17/18 1435 12/21/18 1140  WBC 12.1* 12.1*  NEUTROABS 10.4*  --   HGB 16.2* 15.3*  HCT 50.1* 47.7*  MCV 90.3 93.2  PLT 310 179   Cardiac Enzymes: No results for input(s): CKTOTAL, CKMB, CKMBINDEX, TROPONINI in the last 168 hours. BNP: Invalid input(s): POCBNP CBG: No  results for input(s): GLUCAP in the last 168 hours. D-Dimer No results for input(s): DDIMER in the last 72 hours. Hgb A1c No results for input(s): HGBA1C in the last 72 hours. Lipid Profile No results for input(s): CHOL, HDL, LDLCALC, TRIG, CHOLHDL, LDLDIRECT in the last 72 hours. Thyroid function studies No results for input(s): TSH, T4TOTAL, T3FREE, THYROIDAB in the last 72 hours.  Invalid input(s): FREET3 Anemia work up No results for input(s): VITAMINB12, FOLATE, FERRITIN, TIBC, IRON, RETICCTPCT in the last 72 hours. Urinalysis    Component Value Date/Time   COLORURINE AMBER (A) 12/21/2018 1220   APPEARANCEUR HAZY (A) 12/21/2018 1220   LABSPEC 1.028 12/21/2018 1220   PHURINE 5.0 12/21/2018 1220   GLUCOSEU NEGATIVE 12/21/2018 1220   HGBUR MODERATE (A) 12/21/2018 1220   BILIRUBINUR SMALL (A) 12/21/2018 1220   KETONESUR 20 (A) 12/21/2018 1220   PROTEINUR 30 (A) 12/21/2018 1220   NITRITE NEGATIVE 12/21/2018 1220   LEUKOCYTESUR NEGATIVE 12/21/2018 1220   Sepsis Labs Invalid input(s): PROCALCITONIN,  WBC,  LACTICIDVEN Microbiology Recent Results (from the past 240 hour(s))  SARS CORONAVIRUS 2 (TAT 6-24 HRS) Nasopharyngeal Nasopharyngeal Swab     Status: None   Collection Time: 12/21/18  3:46 PM   Specimen: Nasopharyngeal Swab  Result Value Ref Range Status   SARS Coronavirus 2 NEGATIVE NEGATIVE Final    Comment: (NOTE) SARS-CoV-2 target nucleic acids are NOT DETECTED.  The SARS-CoV-2 RNA is generally detectable in upper and lower respiratory specimens during the acute phase of infection. Negative results do not preclude SARS-CoV-2 infection, do not rule out co-infections with other pathogens, and should not be used as the sole basis for treatment or other patient management decisions. Negative results must be combined with clinical observations, patient history, and epidemiological information. The expected result is Negative. Fact Sheet for  Patients: HairSlick.no Fact Sheet for Healthcare Providers: quierodirigir.com This test is not yet approved or cleared by the [place] FDA and  has been authorized for detection and/or diagnosis of SARS-CoV-2 by FDA under an Emergency Use Authorization (EUA). This EUA will remain  in effect (meaning this test can be used) for the duration of the COVID-19 declaration under Section 56 4(b)(1) of the Act, 21 U.S.C. section 360bbb-3(b)(1), unless the authorization is terminated or revoked sooner. Performed at [facility], [address], [place], [place] [address]      Time coordinating discharge: Over 30 minutes  SIGNED:   Hughie Closs, MD  [facility] 12/22/2018, 1:09 PM  If 7PM-7AM, please contact night-coverage [email or web address] Password [email or web address]

## 2018-12-22 NOTE — Care Management Obs Status (Signed)
[place] NOTIFICATION   Patient Details  Name: Kimberly Stafford MRN: [id number] Date of Birth: 09-25-1945   Medicare Observation Status Notification Given:  Yes    Lia Hopping, [place] 12/22/2018, 4:40 PM

## 2018-12-22 NOTE — Progress Notes (Signed)
    [facility] Pall[facility] Consult Note Telephone: [phone]  Fax: [phone]    [place] Note Telephone: [phone]  Fax: [phone]  1pm: I was scheduled to see patient today. I called niece [name] to discuss her Elenor Legato, and [name] shared that patient was to undergo an evaluation for hospice services with [facility] this evening, after patient is discharged from the ER. I provided [name] with my name and contact information, and she will reach out to me should patient not qualify for hospice, then I would see patient for Palliative Care.   Violeta Gelinas NP-C [facility]  [phone]

## 2018-12-29 DEATH — deceased

## 2018-12-30 ENCOUNTER — Other Ambulatory Visit: Payer: Medicare Other

## 2019-07-16 ENCOUNTER — Ambulatory Visit: Payer: Medicare Other | Admitting: Neurology

## 2020-01-04 IMAGING — CT CT CERVICAL SPINE W/O CM
3 of 4 series · 12 of 33 positions shown, 14 images · non-contrast
Comparison: CT 10/31/2018

CLINICAL DATA: Head trauma, ataxia, unwitnessed fall

EXAM:
CT HEAD WITHOUT CONTRAST
CT CERVICAL SPINE WITHOUT CONTRAST
TECHNIQUE: Multidetector CT imaging of the head and cervical spine was
performed following the standard protocol without intravenous
contrast. Multiplanar CT image reconstructions of the cervical spine
were also generated.

[Series 6: orthogonal bone · axial · 0.23mm/px · z∈[-247,-133]mm · 4 of 87 slices shown, 5 images]
[im 15/87  soft-tissue]
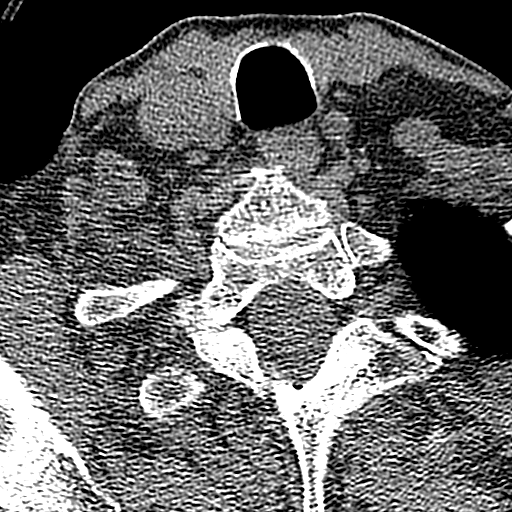
[im 15/87  bone]
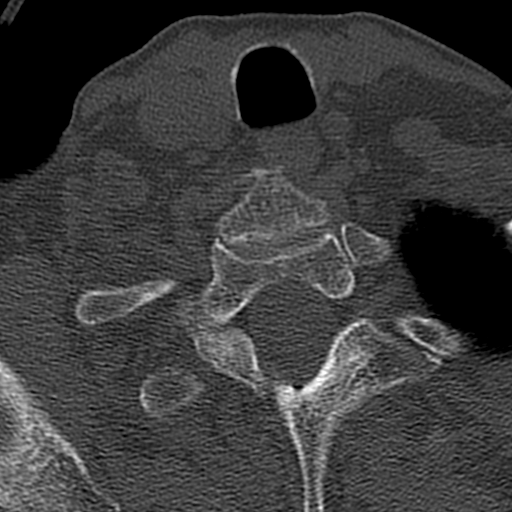
[im 29/87  bone]
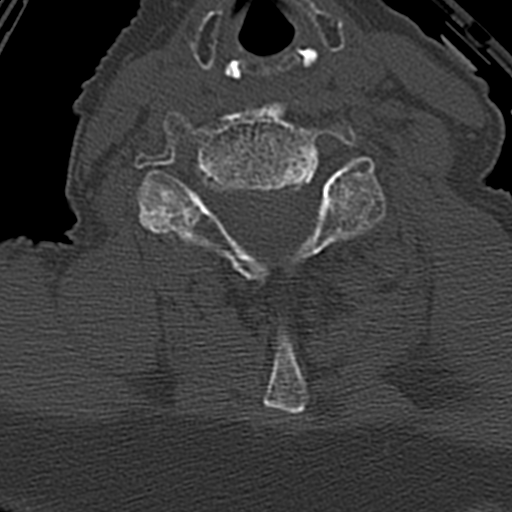
[im 58/87  bone]
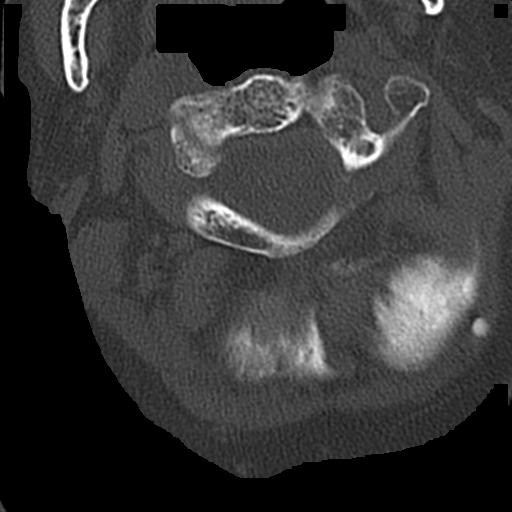
[im 72/87  bone]
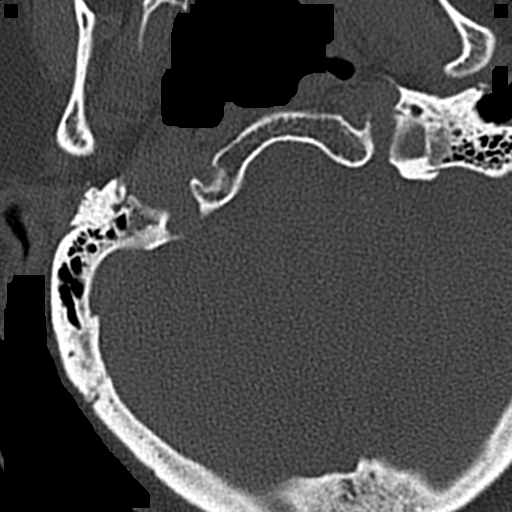

[Series 7: coronal bone · coronal · 0.23mm/px · 3 of 61 slices shown]
[im 13/61  bone]
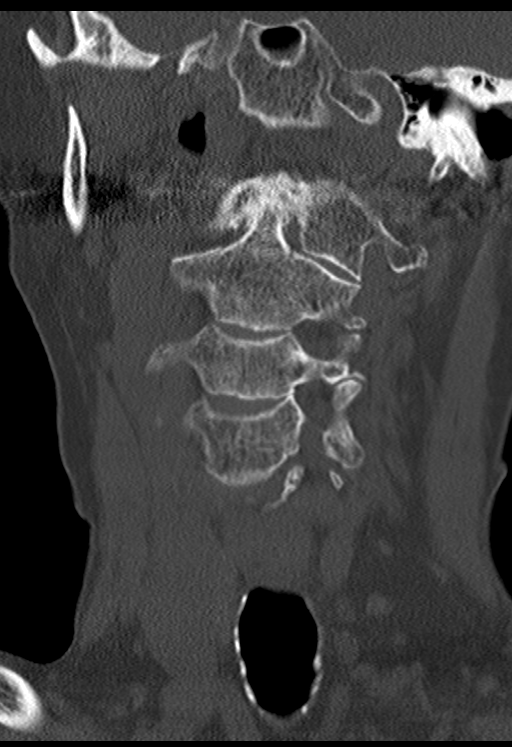
[im 25/61  bone]
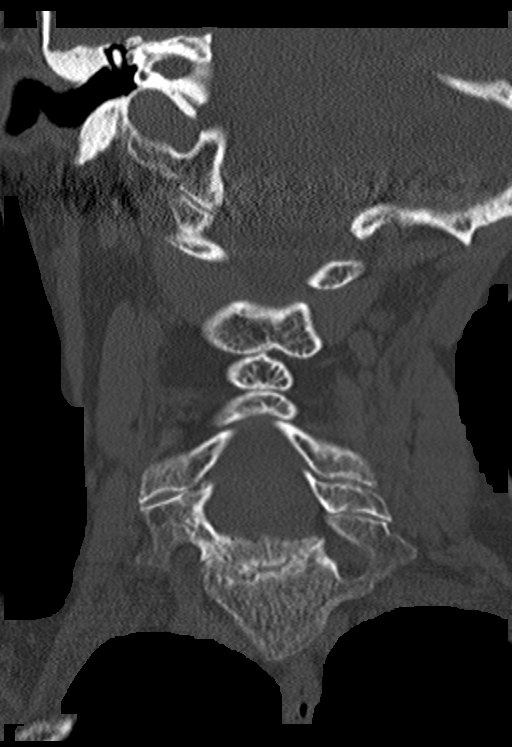
[im 37/61  bone]
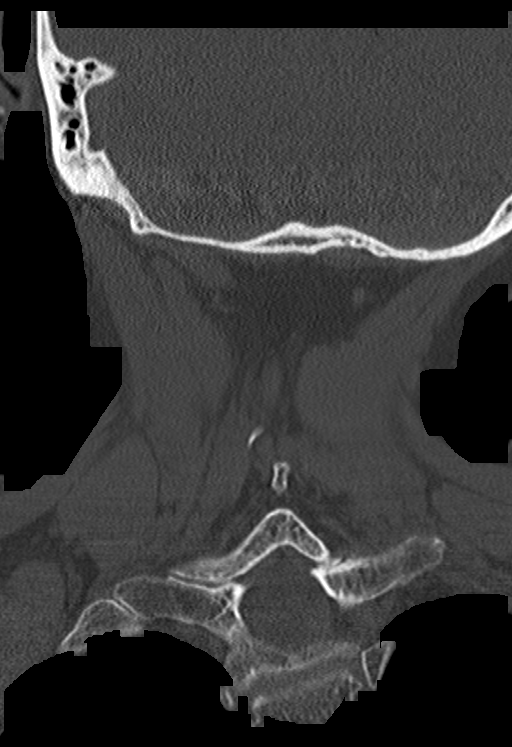

[Series 8: sagittal bone · sagittal · 0.23mm/px · 5 of 61 slices shown, 6 images]
[im 21/61  bone]
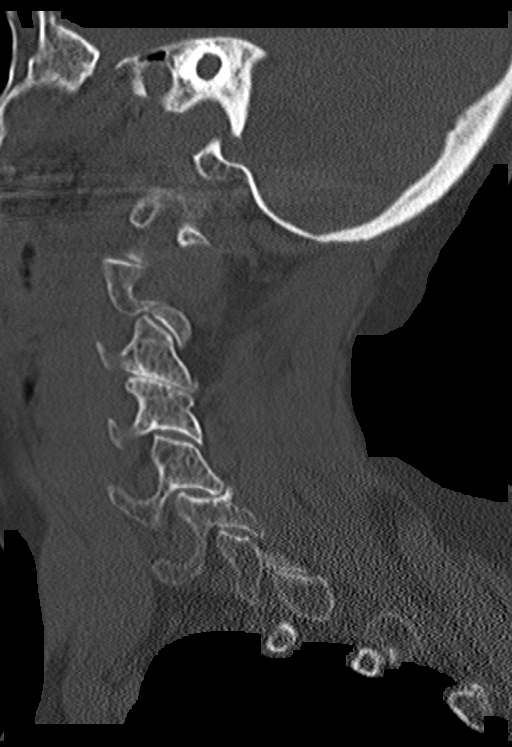
[im 26/61  bone]
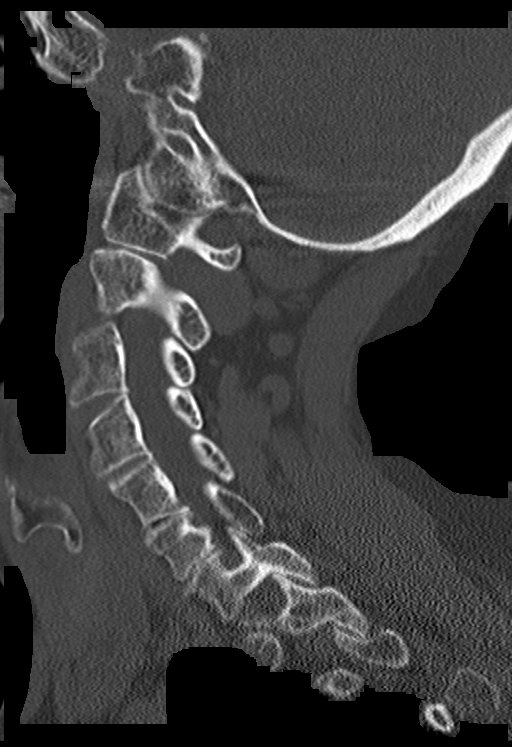
[im 31/61  soft-tissue]
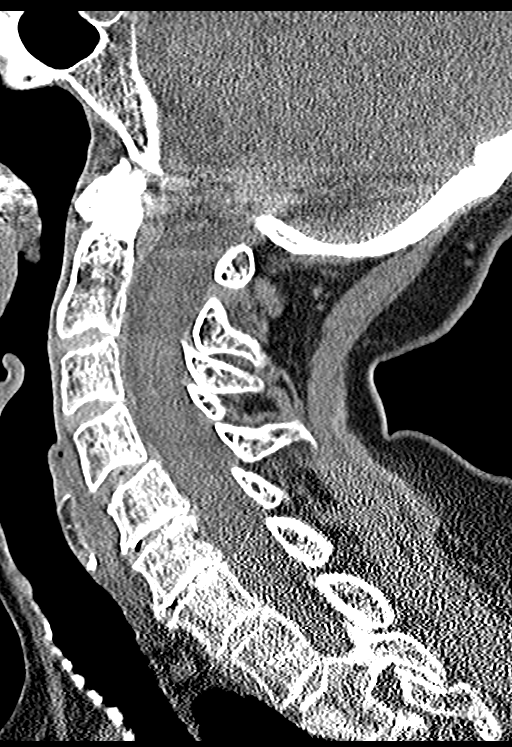
[im 31/61  bone]
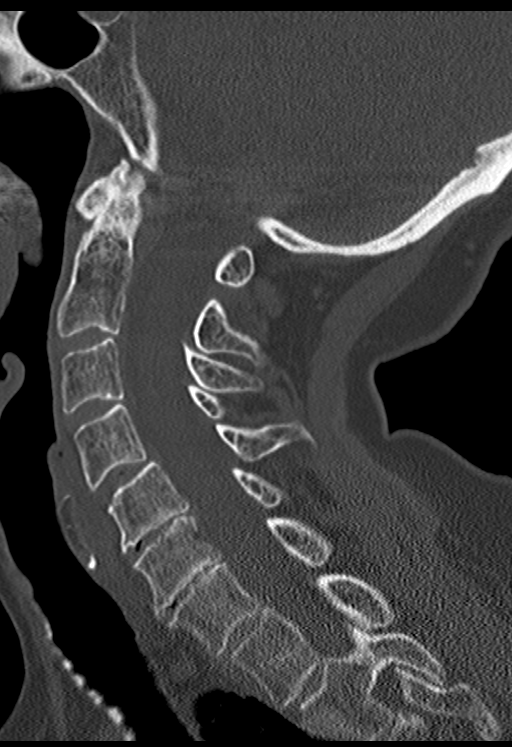
[im 36/61  bone]
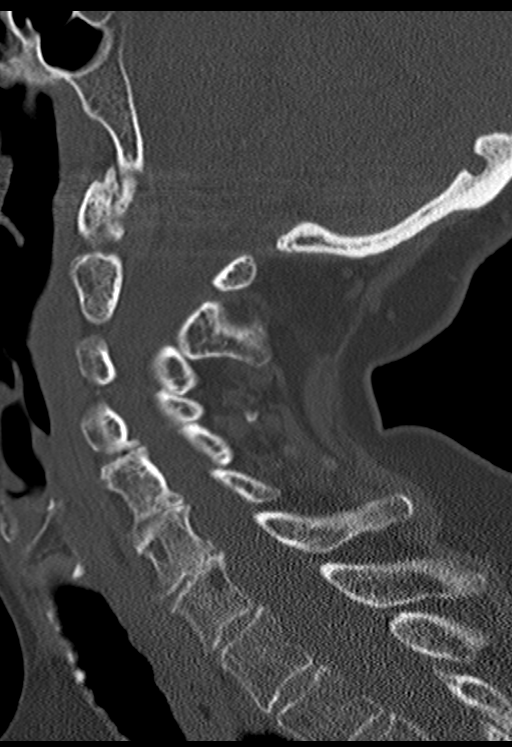
[im 41/61  bone]
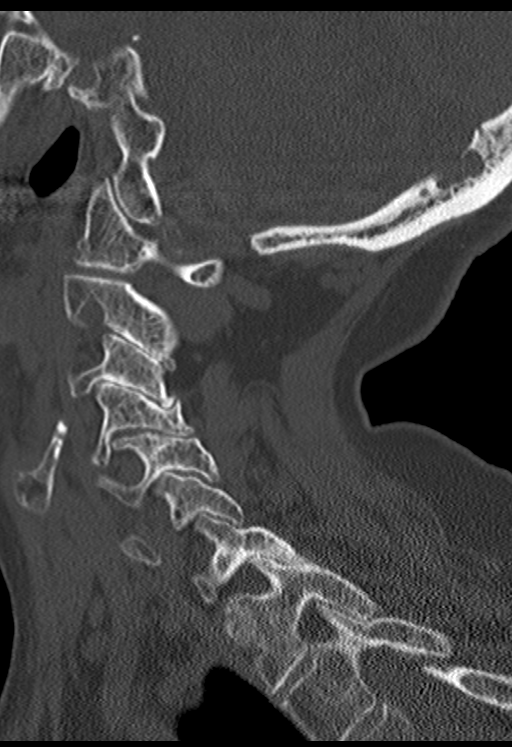

[12 of 33 positions shown; findings below may reference images not displayed]

FINDINGS: CT HEAD FINDINGS

Brain: No evidence of acute infarction, hemorrhage, hydrocephalus,
extra-axial collection or mass lesion/mass effect. Symmetric
prominence of the ventricles, cisterns and sulci compatible with
parenchymal volume loss. Patchy areas of white matter
hypoattenuation are most compatible with chronic microvascular
angiopathy.

Vascular: Atherosclerotic calcification of the carotid siphons and
intradural vertebral arteries. No hyperdense vessel.

Skull: Mild left supraorbital soft tissue thickening. No subjacent
hematoma or calvarial fracture. No suspicious osseous lesions.
Hyperostosis frontalis interna is noted incidentally

Sinuses/Orbits: Small amount of fluid layering in the sphenoid
sinus, similar to prior. Remaining paranasal sinuses are
predominantly clear. Mastoid air cells and middle ear cavities are
clear.

Other: None

CT CERVICAL SPINE FINDINGS

Alignment: Preservation of the normal cervical lordosis. Mild
dextrocurvature of the cervical spine possibly positional with
stabilization collar in place. Slight anterolisthesis of C4 on C5
and retrolisthesis C5 on C6 is on a degenerative basis, unchanged
from comparison study. No abnormal facet widening. Craniocervical
and atlantoaxial articulations are normally aligned.

Skull base and vertebrae: No acute fracture. No primary bone lesion
or focal pathologic process.

Soft tissues and spinal canal: No pre or paravertebral fluid or
swelling. No visible canal hematoma.

Disc levels: Multilevel intervertebral disc height loss with
spondylitic endplate changes. Mild to moderate facet hypertrophic
changes are noted as well. Findings are maximal at C5-6 and C6-7. At
most mild multilevel neural foraminal narrowing. No significant
canal stenosis.

Upper chest: Apical pleuroparenchymal changes are noted.

Other: Mild cervical carotid atherosclerosis. Included portions of
the thyroid are unremarkable.
IMPRESSION: 1. No acute intracranial abnormality.
2. Stable parenchymal volume loss and chronic microvascular ischemic
white matter disease.
3. No acute cervical spine fracture or traumatic listhesis.
4. Stable multilevel degenerative changes of the cervical spine,
detailed above.

## 2020-01-04 IMAGING — CT CT HEAD W/O CM
2 series · 15 of 37 positions shown, 18 images · non-contrast
Comparison: CT 10/31/2018

CLINICAL DATA: Head trauma, ataxia, unwitnessed fall

EXAM:
CT HEAD WITHOUT CONTRAST
CT CERVICAL SPINE WITHOUT CONTRAST
TECHNIQUE: Multidetector CT imaging of the head and cervical spine was
performed following the standard protocol without intravenous
contrast. Multiplanar CT image reconstructions of the cervical spine
were also generated.

[Series 3: head wo · axial · 0.38mm/px · z∈[-141,+4]mm · 12 of 35 slices shown, 15 images]
[im 3/35  brain]
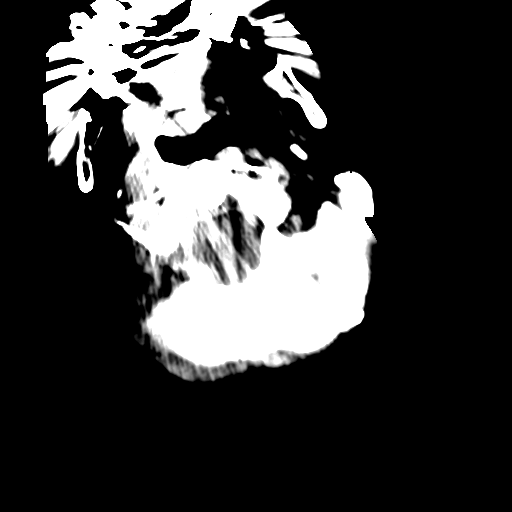
[im 3/35  bone]
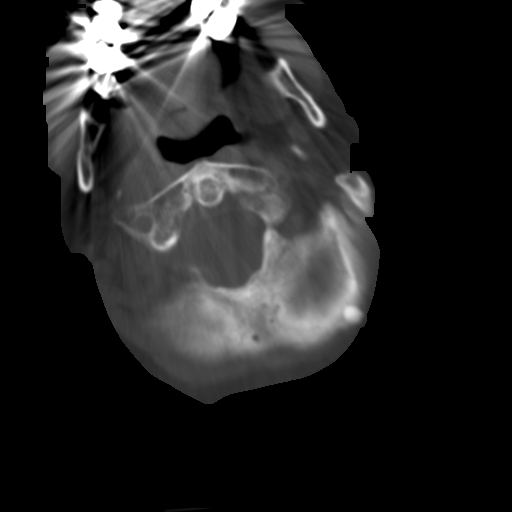
[im 5/35  brain]
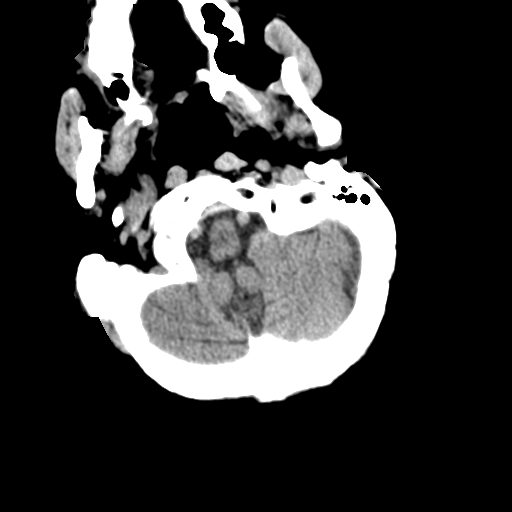
[im 8/35  brain]
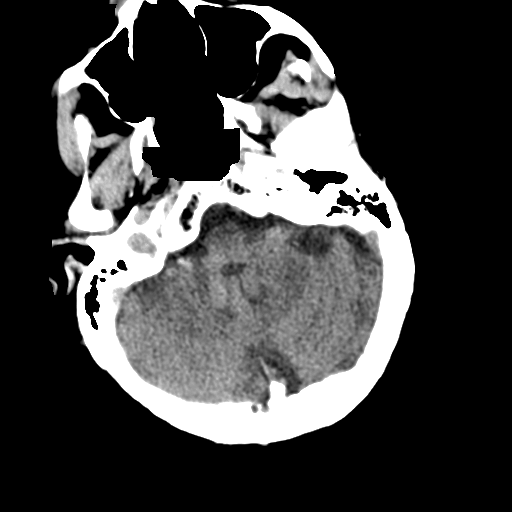
[im 11/35  brain]
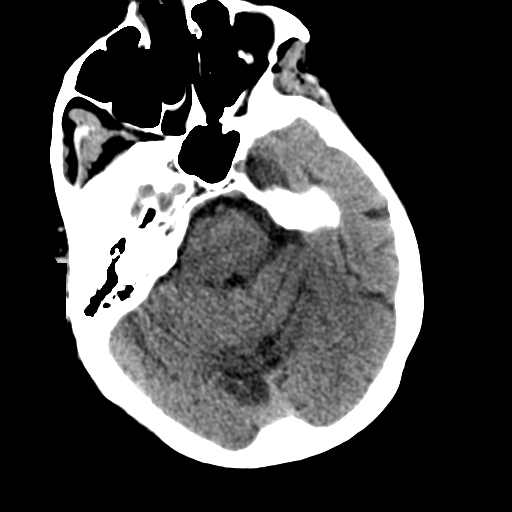
[im 13/35  brain]
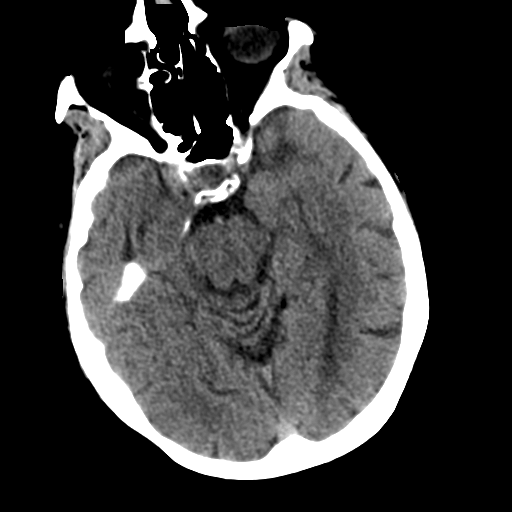
[im 13/35  bone]
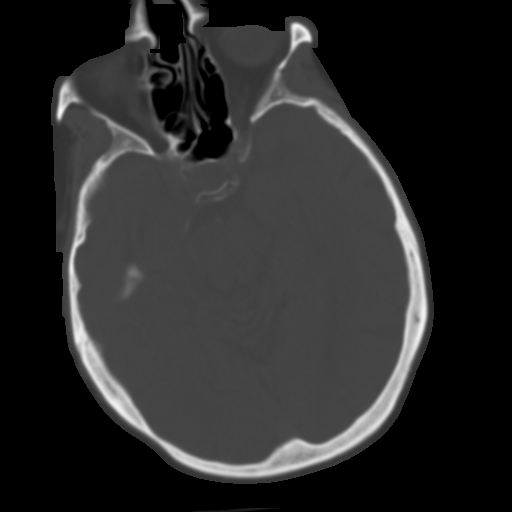
[im 16/35  brain]
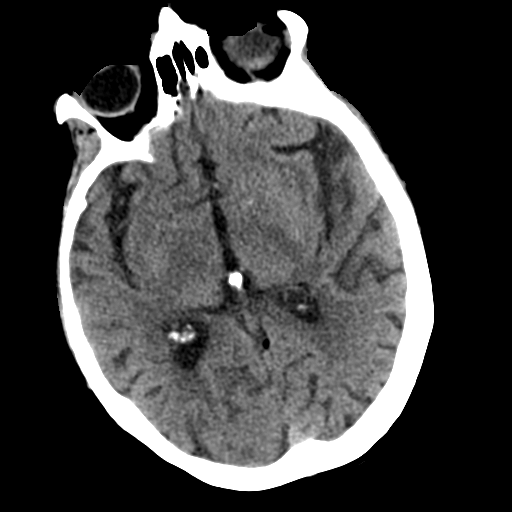
[im 19/35  brain]
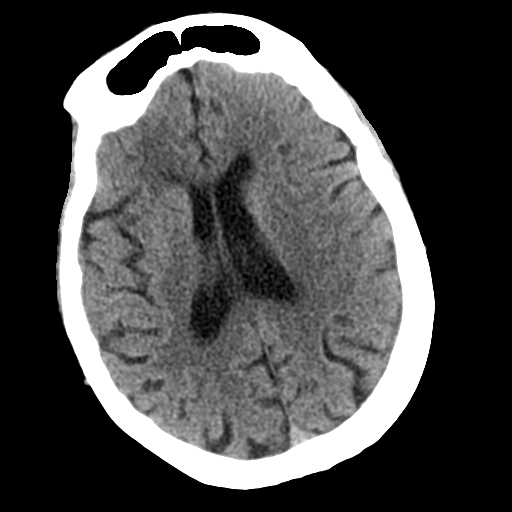
[im 22/35  brain]
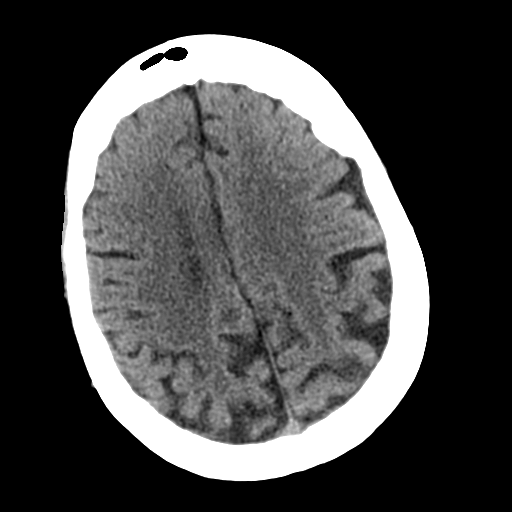
[im 24/35  brain]
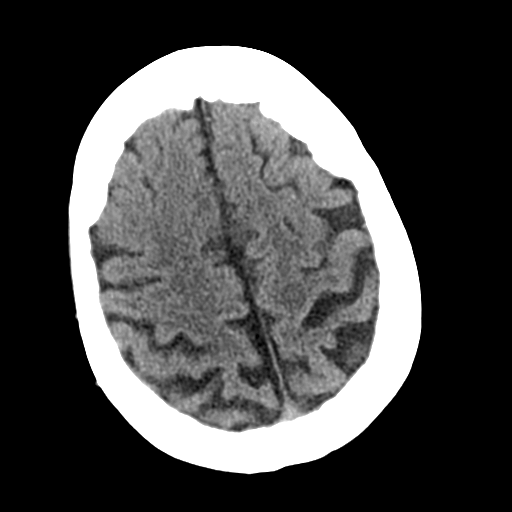
[im 24/35  bone]
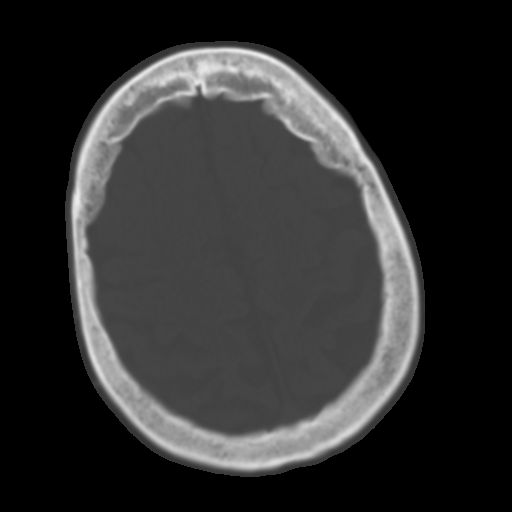
[im 27/35  brain]
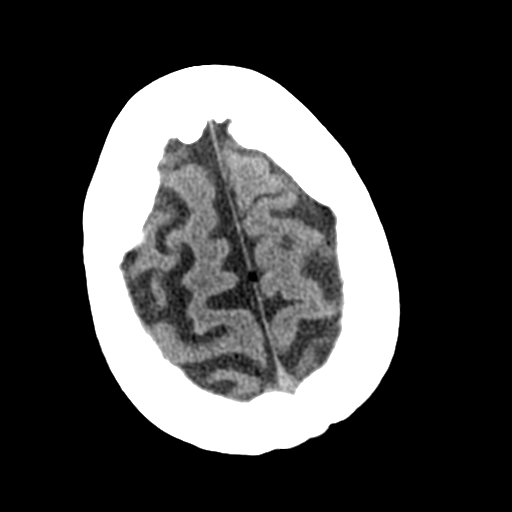
[im 30/35  brain]
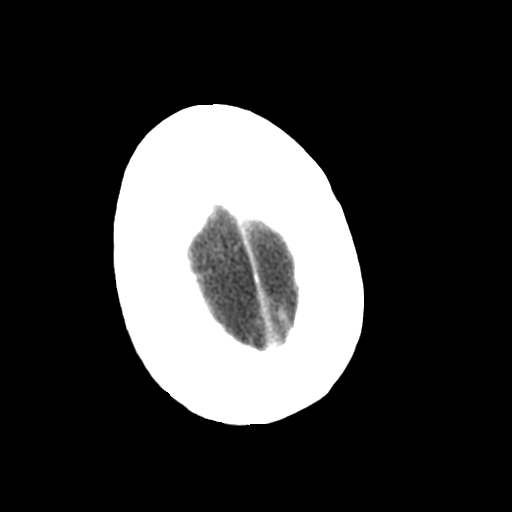
[im 32/35  brain]
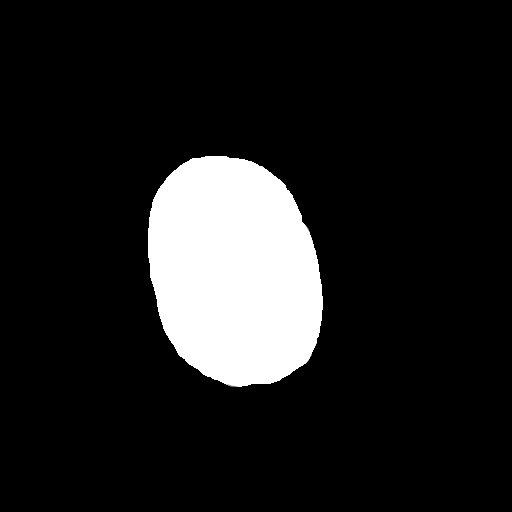

[Series 7: sagittal soft tissue · sagittal · 0.34mm/px · 3 of 51 slices shown]
[im 17/51  brain]
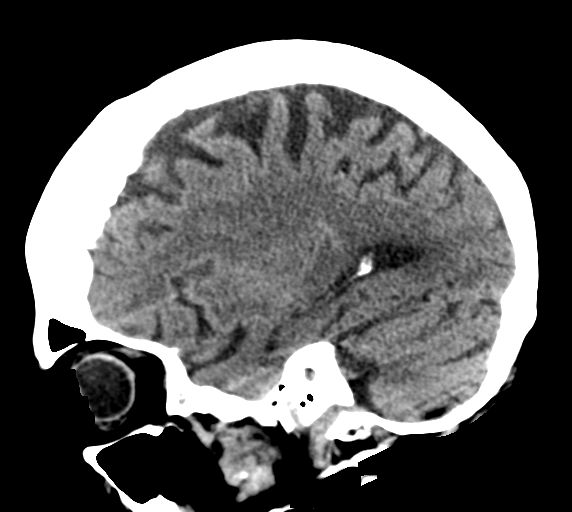
[im 26/51  brain]
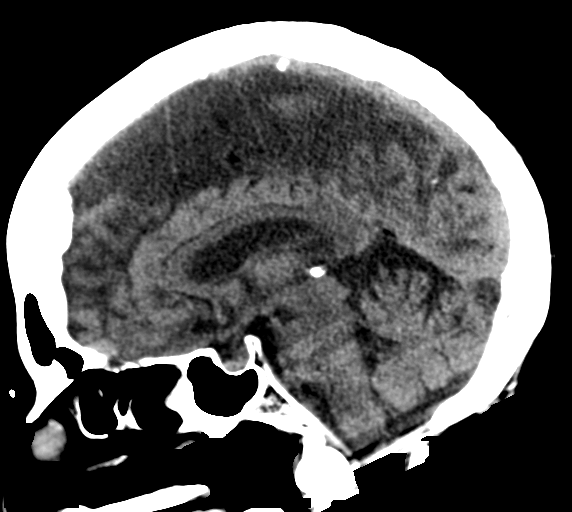
[im 34/51  brain]
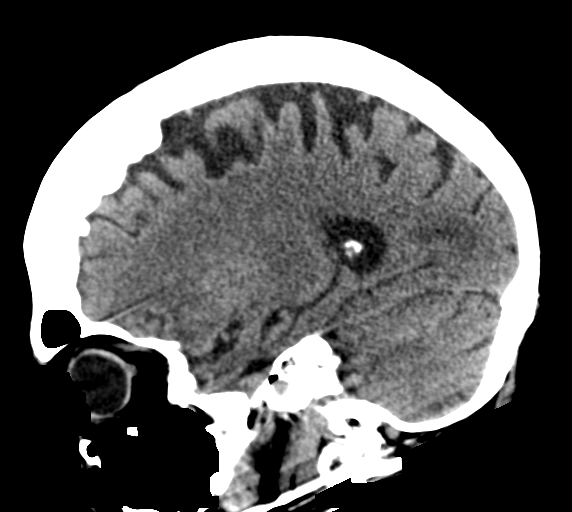

[15 of 37 positions shown; findings below may reference images not displayed]

FINDINGS: CT HEAD FINDINGS

Brain: No evidence of acute infarction, hemorrhage, hydrocephalus,
extra-axial collection or mass lesion/mass effect. Symmetric
prominence of the ventricles, cisterns and sulci compatible with
parenchymal volume loss. Patchy areas of white matter
hypoattenuation are most compatible with chronic microvascular
angiopathy.

Vascular: Atherosclerotic calcification of the carotid siphons and
intradural vertebral arteries. No hyperdense vessel.

Skull: Mild left supraorbital soft tissue thickening. No subjacent
hematoma or calvarial fracture. No suspicious osseous lesions.
Hyperostosis frontalis interna is noted incidentally

Sinuses/Orbits: Small amount of fluid layering in the sphenoid
sinus, similar to prior. Remaining paranasal sinuses are
predominantly clear. Mastoid air cells and middle ear cavities are
clear.

Other: None

CT CERVICAL SPINE FINDINGS

Alignment: Preservation of the normal cervical lordosis. Mild
dextrocurvature of the cervical spine possibly positional with
stabilization collar in place. Slight anterolisthesis of C4 on C5
and retrolisthesis C5 on C6 is on a degenerative basis, unchanged
from comparison study. No abnormal facet widening. Craniocervical
and atlantoaxial articulations are normally aligned.

Skull base and vertebrae: No acute fracture. No primary bone lesion
or focal pathologic process.

Soft tissues and spinal canal: No pre or paravertebral fluid or
swelling. No visible canal hematoma.

Disc levels: Multilevel intervertebral disc height loss with
spondylitic endplate changes. Mild to moderate facet hypertrophic
changes are noted as well. Findings are maximal at C5-6 and C6-7. At
most mild multilevel neural foraminal narrowing. No significant
canal stenosis.

Upper chest: Apical pleuroparenchymal changes are noted.

Other: Mild cervical carotid atherosclerosis. Included portions of
the thyroid are unremarkable.
IMPRESSION: 1. No acute intracranial abnormality.
2. Stable parenchymal volume loss and chronic microvascular ischemic
white matter disease.
3. No acute cervical spine fracture or traumatic listhesis.
4. Stable multilevel degenerative changes of the cervical spine,
detailed above.
